# Patient Record
Sex: Female | Born: 1980 | Race: White | Hispanic: No | Marital: Single | Smoking: Former smoker
Health system: Southern US, Community
[De-identification: ages and names within clinical notes are randomized; demographics above are authoritative.]

## PROBLEM LIST (undated history)

## (undated) ENCOUNTER — Inpatient Hospital Stay (HOSPITAL_COMMUNITY): Payer: Self-pay

## (undated) DIAGNOSIS — R87629 Unspecified abnormal cytological findings in specimens from vagina: Secondary | ICD-10-CM

## (undated) DIAGNOSIS — R51 Headache: Secondary | ICD-10-CM

## (undated) DIAGNOSIS — F32A Depression, unspecified: Secondary | ICD-10-CM

## (undated) DIAGNOSIS — A749 Chlamydial infection, unspecified: Secondary | ICD-10-CM

## (undated) DIAGNOSIS — O98219 Gonorrhea complicating pregnancy, unspecified trimester: Secondary | ICD-10-CM

## (undated) DIAGNOSIS — IMO0002 Reserved for concepts with insufficient information to code with codable children: Secondary | ICD-10-CM

## (undated) DIAGNOSIS — F329 Major depressive disorder, single episode, unspecified: Secondary | ICD-10-CM

## (undated) DIAGNOSIS — N200 Calculus of kidney: Secondary | ICD-10-CM

## (undated) HISTORY — PX: COLPOSCOPY: SHX161

## (undated) HISTORY — DX: Unspecified abnormal cytological findings in specimens from vagina: R87.629

## (undated) HISTORY — DX: Major depressive disorder, single episode, unspecified: F32.9

## (undated) HISTORY — DX: Depression, unspecified: F32.A

## (undated) HISTORY — PX: LEEP: SHX91

---

## 2009-08-22 ENCOUNTER — Emergency Department (HOSPITAL_COMMUNITY): Admission: EM | Admit: 2009-08-22 | Discharge: 2009-08-22 | Payer: Self-pay | Admitting: Emergency Medicine

## 2010-09-05 ENCOUNTER — Inpatient Hospital Stay (HOSPITAL_COMMUNITY)
Admission: AD | Admit: 2010-09-05 | Discharge: 2010-09-06 | Payer: Self-pay | Source: Home / Self Care | Admitting: Obstetrics and Gynecology

## 2010-10-09 ENCOUNTER — Encounter (INDEPENDENT_AMBULATORY_CARE_PROVIDER_SITE_OTHER): Payer: Self-pay | Admitting: *Deleted

## 2010-10-09 ENCOUNTER — Ambulatory Visit
Admission: RE | Admit: 2010-10-09 | Discharge: 2010-10-09 | Payer: Self-pay | Source: Home / Self Care | Attending: Obstetrics and Gynecology | Admitting: Obstetrics and Gynecology

## 2010-10-09 LAB — CONVERTED CEMR LAB
Antibody Screen: NEGATIVE
Basophils Absolute: 0 10*3/uL (ref 0.0–0.1)
Basophils Relative: 0 % (ref 0–1)
Eosinophils Absolute: 0.2 10*3/uL (ref 0.0–0.7)
Eosinophils Relative: 2 % (ref 0–5)
HCT: 33.9 % — ABNORMAL LOW (ref 36.0–46.0)
HIV: NONREACTIVE
Hemoglobin: 11 g/dL — ABNORMAL LOW (ref 12.0–15.0)
Hepatitis B Surface Ag: NEGATIVE
Lymphocytes Relative: 21 % (ref 12–46)
Lymphs Abs: 2.5 10*3/uL (ref 0.7–4.0)
MCHC: 32.4 g/dL (ref 30.0–36.0)
MCV: 94.7 fL (ref 78.0–100.0)
Monocytes Absolute: 0.9 10*3/uL (ref 0.1–1.0)
Monocytes Relative: 7 % (ref 3–12)
Neutro Abs: 8.4 10*3/uL — ABNORMAL HIGH (ref 1.7–7.7)
Neutrophils Relative %: 70 % (ref 43–77)
Platelets: 218 10*3/uL (ref 150–400)
RBC: 3.58 M/uL — ABNORMAL LOW (ref 3.87–5.11)
RDW: 14 % (ref 11.5–15.5)
Rh Type: POSITIVE
Rubella: 59.8 intl units/mL — ABNORMAL HIGH
WBC: 12 10*3/uL — ABNORMAL HIGH (ref 4.0–10.5)

## 2010-10-10 ENCOUNTER — Encounter (INDEPENDENT_AMBULATORY_CARE_PROVIDER_SITE_OTHER): Payer: Self-pay | Admitting: *Deleted

## 2010-10-10 ENCOUNTER — Ambulatory Visit
Admission: RE | Admit: 2010-10-10 | Discharge: 2010-10-10 | Payer: Self-pay | Source: Home / Self Care | Attending: Obstetrics and Gynecology | Admitting: Obstetrics and Gynecology

## 2010-10-10 ENCOUNTER — Encounter: Payer: Self-pay | Admitting: Obstetrics and Gynecology

## 2010-10-10 LAB — POCT URINALYSIS DIPSTICK
Bilirubin Urine: NEGATIVE
Hemoglobin, Urine: NEGATIVE
Nitrite: NEGATIVE
Protein, ur: NEGATIVE mg/dL
Specific Gravity, Urine: >=1.03 (ref 1.005–1.030)
Urine Glucose, Fasting: NEGATIVE mg/dL
Urobilinogen, UA: 0.2 mg/dL (ref 0.0–1.0)
pH: 5.5 (ref 5.0–8.0)

## 2010-10-17 ENCOUNTER — Ambulatory Visit
Admission: RE | Admit: 2010-10-17 | Discharge: 2010-10-17 | Payer: Self-pay | Source: Home / Self Care | Attending: Obstetrics and Gynecology | Admitting: Obstetrics and Gynecology

## 2010-10-18 ENCOUNTER — Inpatient Hospital Stay (HOSPITAL_COMMUNITY)
Admission: AD | Admit: 2010-10-18 | Discharge: 2010-10-18 | Payer: Self-pay | Source: Home / Self Care | Attending: Obstetrics and Gynecology | Admitting: Obstetrics and Gynecology

## 2010-10-22 LAB — POCT URINALYSIS DIPSTICK
Bilirubin Urine: NEGATIVE
Hgb urine dipstick: NEGATIVE
Nitrite: NEGATIVE
Protein, ur: 30 mg/dL — AB
Specific Gravity, Urine: 1.025 (ref 1.005–1.030)
Urine Glucose, Fasting: NEGATIVE mg/dL
Urobilinogen, UA: 0.2 mg/dL (ref 0.0–1.0)
pH: 6 (ref 5.0–8.0)

## 2010-10-24 ENCOUNTER — Ambulatory Visit
Admission: RE | Admit: 2010-10-24 | Discharge: 2010-10-24 | Payer: Self-pay | Source: Home / Self Care | Attending: Obstetrics and Gynecology | Admitting: Obstetrics and Gynecology

## 2010-10-25 ENCOUNTER — Encounter: Payer: Self-pay | Admitting: Obstetrics and Gynecology

## 2010-10-29 LAB — POCT URINALYSIS DIPSTICK
Hgb urine dipstick: NEGATIVE
Ketones, ur: 15 mg/dL — AB
Nitrite: NEGATIVE
Protein, ur: 30 mg/dL — AB
Specific Gravity, Urine: >=1.03 (ref 1.005–1.030)
Urine Glucose, Fasting: NEGATIVE mg/dL
Urobilinogen, UA: 0.2 mg/dL (ref 0.0–1.0)
pH: 6 (ref 5.0–8.0)

## 2010-10-31 ENCOUNTER — Ambulatory Visit
Admission: RE | Admit: 2010-10-31 | Discharge: 2010-10-31 | Payer: Self-pay | Source: Home / Self Care | Attending: Obstetrics and Gynecology | Admitting: Obstetrics and Gynecology

## 2010-10-31 LAB — POCT URINALYSIS DIPSTICK
Bilirubin Urine: NEGATIVE
Hgb urine dipstick: NEGATIVE
Nitrite: NEGATIVE
Protein, ur: NEGATIVE mg/dL
Specific Gravity, Urine: 1.025 (ref 1.005–1.030)
Urine Glucose, Fasting: NEGATIVE mg/dL
Urobilinogen, UA: 0.2 mg/dL (ref 0.0–1.0)
pH: 6 (ref 5.0–8.0)

## 2010-11-03 ENCOUNTER — Inpatient Hospital Stay (HOSPITAL_COMMUNITY)
Admission: AD | Admit: 2010-11-03 | Discharge: 2010-11-03 | Payer: Self-pay | Source: Home / Self Care | Attending: Obstetrics & Gynecology | Admitting: Obstetrics & Gynecology

## 2010-11-03 LAB — URINALYSIS, ROUTINE W REFLEX MICROSCOPIC
Bilirubin Urine: NEGATIVE
Hgb urine dipstick: NEGATIVE
Ketones, ur: NEGATIVE mg/dL
Nitrite: NEGATIVE
Protein, ur: NEGATIVE mg/dL
Specific Gravity, Urine: 1.025 (ref 1.005–1.030)
Urine Glucose, Fasting: NEGATIVE mg/dL
Urobilinogen, UA: 0.2 mg/dL (ref 0.0–1.0)
pH: 6.5 (ref 5.0–8.0)

## 2010-11-03 LAB — URINE MICROSCOPIC-ADD ON

## 2010-11-05 ENCOUNTER — Inpatient Hospital Stay (HOSPITAL_COMMUNITY)
Admission: AD | Admit: 2010-11-05 | Discharge: 2010-11-05 | Payer: Self-pay | Source: Home / Self Care | Attending: Family Medicine | Admitting: Family Medicine

## 2010-11-07 ENCOUNTER — Other Ambulatory Visit: Payer: Self-pay

## 2010-11-07 DIAGNOSIS — O0930 Supervision of pregnancy with insufficient antenatal care, unspecified trimester: Secondary | ICD-10-CM

## 2010-11-08 LAB — POCT URINALYSIS DIPSTICK
Bilirubin Urine: NEGATIVE
Hgb urine dipstick: NEGATIVE
Ketones, ur: NEGATIVE mg/dL
Nitrite: NEGATIVE
Protein, ur: NEGATIVE mg/dL
Specific Gravity, Urine: 1.025 (ref 1.005–1.030)
Urine Glucose, Fasting: NEGATIVE mg/dL
Urobilinogen, UA: 0.2 mg/dL (ref 0.0–1.0)
pH: 6 (ref 5.0–8.0)

## 2010-11-14 ENCOUNTER — Other Ambulatory Visit: Payer: Self-pay

## 2010-11-14 DIAGNOSIS — O0930 Supervision of pregnancy with insufficient antenatal care, unspecified trimester: Secondary | ICD-10-CM

## 2010-11-14 LAB — POCT URINALYSIS DIPSTICK
Bilirubin Urine: NEGATIVE
Hgb urine dipstick: NEGATIVE
Nitrite: NEGATIVE
Protein, ur: NEGATIVE mg/dL
Specific Gravity, Urine: 1.025 (ref 1.005–1.030)
Urine Glucose, Fasting: NEGATIVE mg/dL
Urobilinogen, UA: 0.2 mg/dL (ref 0.0–1.0)
pH: 6.5 (ref 5.0–8.0)

## 2010-11-16 ENCOUNTER — Inpatient Hospital Stay (HOSPITAL_COMMUNITY)
Admission: AD | Admit: 2010-11-16 | Discharge: 2010-11-18 | DRG: 775 | Disposition: A | Discharge status: Home / Self Care | Payer: Medicaid Other | Source: Ambulatory Visit | Attending: Obstetrics & Gynecology | Admitting: Obstetrics & Gynecology

## 2010-11-16 DIAGNOSIS — Z22330 Carrier of Group B streptococcus: Secondary | ICD-10-CM

## 2010-11-16 DIAGNOSIS — O9989 Other specified diseases and conditions complicating pregnancy, childbirth and the puerperium: Secondary | ICD-10-CM

## 2010-11-16 DIAGNOSIS — O99892 Other specified diseases and conditions complicating childbirth: Principal | ICD-10-CM | POA: Diagnosis present

## 2010-11-16 LAB — CBC
HCT: 36.3 % (ref 36.0–46.0)
Hemoglobin: 12 g/dL (ref 12.0–15.0)
MCH: 30.4 pg (ref 26.0–34.0)
MCHC: 33.1 g/dL (ref 30.0–36.0)
MCV: 91.9 fL (ref 78.0–100.0)
Platelets: 242 10*3/uL (ref 150–400)
RBC: 3.95 MIL/uL (ref 3.87–5.11)
RDW: 13.6 % (ref 11.5–15.5)
WBC: 11.3 10*3/uL — ABNORMAL HIGH (ref 4.0–10.5)

## 2010-11-16 LAB — RPR: RPR Ser Ql: NONREACTIVE

## 2010-11-19 ENCOUNTER — Other Ambulatory Visit: Payer: Self-pay

## 2010-11-21 ENCOUNTER — Other Ambulatory Visit: Payer: Self-pay

## 2010-12-14 ENCOUNTER — Ambulatory Visit (INDEPENDENT_AMBULATORY_CARE_PROVIDER_SITE_OTHER): Payer: Medicaid Other | Admitting: Family Medicine

## 2010-12-19 LAB — URINALYSIS, ROUTINE W REFLEX MICROSCOPIC
Bilirubin Urine: NEGATIVE
Glucose, UA: NEGATIVE mg/dL
Hgb urine dipstick: NEGATIVE
Ketones, ur: NEGATIVE mg/dL
Nitrite: NEGATIVE
Protein, ur: NEGATIVE mg/dL
Specific Gravity, Urine: 1.02 (ref 1.005–1.030)
Urobilinogen, UA: 0.2 mg/dL (ref 0.0–1.0)
pH: 6.5 (ref 5.0–8.0)

## 2010-12-19 LAB — GC/CHLAMYDIA PROBE AMP, GENITAL
Chlamydia, DNA Probe: POSITIVE — AB
GC Probe Amp, Genital: NEGATIVE

## 2010-12-19 LAB — WET PREP, GENITAL
Clue Cells Wet Prep HPF POC: NONE SEEN
Trich, Wet Prep: NONE SEEN
Yeast Wet Prep HPF POC: NONE SEEN

## 2010-12-19 LAB — POCT PREGNANCY, URINE: Preg Test, Ur: POSITIVE

## 2010-12-28 NOTE — Progress Notes (Signed)
[name] [name], [name] [name] NO.:  [id number]  MEDICAL RECORD NO.:  [id number]           PATIENT TYPE:  A  LOCATION:  [facility]                   FACILITY:  [facility]  PHYSICIAN:  Tinnie Gens, MD        DATE OF BIRTH:  [birth year]  DATE OF SERVICE:                                 CLINIC NOTE  CHIEF COMPLAINT:  Postpartum check.  HISTORY OF PRESENT ILLNESS:  The patient is a 30 year old gravida 6, para 3-2-1-5 who is 6 weeks postpartum from vaginal delivery of a female infant that was 7 pounds 0 ounces.  She gave the baby for adoption to a family friend and she is still seeing the baby once a week or so.  She breast fed the baby for several weeks, but now they are bottle feeding. She does not have the baby at home, so there are no issues with that. She reports her mood was somewhat sad, but is now improving.  Her Edinburgh Postnatal Depression Scale is 7.  She is interested in pill for birth control.  She has not resumed sexual intercourse as yet.  She was on oral contraceptives in the past without difficulty.  She is a smoker.  PHYSICAL EXAMINATION:  VITAL SIGNS:  Today, as noted in the chart. GENERAL:  She is a well developed, well nourished woman in no acute distress. ABDOMEN:  Soft, nontender, nondistended. GU:  Normal external female genitalia.  BUS normal.  Vagina is pink and rugated.  Cervix is parous without lesion.  Uterus is small, well involuted, and nontender.  IMPRESSION:  Postpartum check, doing well.  No sign or symptoms of depression.  Last Pap was October 10, 2010.  Desiring oral contraceptives for birth control.  PLAN: 1. Return in 10 months for a Pap and pelvic. 2. Ovcon 1/35 one p.o. daily is given for birth control, 1-year     prescription was given.          ______________________________ Tinnie Gens, MD    TP/MEDQ  D:  12/14/2010  T:  12/15/2010  Job:  [id number]

## 2011-01-07 ENCOUNTER — Emergency Department (HOSPITAL_COMMUNITY)
Admission: EM | Admit: 2011-01-07 | Discharge: 2011-01-07 | Disposition: A | Discharge status: Home / Self Care | Payer: Medicaid Other | Attending: Emergency Medicine | Admitting: Emergency Medicine

## 2011-01-07 ENCOUNTER — Emergency Department (HOSPITAL_COMMUNITY): Payer: Medicaid Other

## 2011-01-07 DIAGNOSIS — R1031 Right lower quadrant pain: Secondary | ICD-10-CM | POA: Insufficient documentation

## 2011-01-07 DIAGNOSIS — N201 Calculus of ureter: Secondary | ICD-10-CM | POA: Insufficient documentation

## 2011-01-07 LAB — COMPREHENSIVE METABOLIC PANEL
ALT: 13 U/L (ref 0–35)
AST: 13 U/L (ref 0–37)
Albumin: 3.3 g/dL — ABNORMAL LOW (ref 3.5–5.2)
Alkaline Phosphatase: 66 U/L (ref 39–117)
BUN: 6 mg/dL (ref 6–23)
CO2: 23 mEq/L (ref 19–32)
Calcium: 9 mg/dL (ref 8.4–10.5)
Chloride: 109 mEq/L (ref 96–112)
Creatinine, Ser: 1.07 mg/dL (ref 0.4–1.2)
GFR calc Af Amer: >60 mL/min (ref >60)
GFR calc non Af Amer: >60 mL/min (ref >60)
Glucose, Bld: 100 mg/dL — ABNORMAL HIGH (ref 70–99)
Potassium: 3.7 mEq/L (ref 3.5–5.1)
Sodium: 139 mEq/L (ref 135–145)
Total Bilirubin: 0.7 mg/dL (ref 0.3–1.2)
Total Protein: 6.4 g/dL (ref 6.0–8.3)

## 2011-01-07 LAB — URINALYSIS, ROUTINE W REFLEX MICROSCOPIC
Glucose, UA: NEGATIVE mg/dL
pH: 6 (ref 5.0–8.0)

## 2011-01-07 LAB — URINE MICROSCOPIC-ADD ON

## 2011-01-07 LAB — DIFFERENTIAL
Basophils Absolute: 0 10*3/uL (ref 0.0–0.1)
Basophils Relative: 0 % (ref 0–1)
Eosinophils Absolute: 0.1 10*3/uL (ref 0.0–0.7)
Eosinophils Relative: 1 % (ref 0–5)
Lymphocytes Relative: 13 % (ref 12–46)
Lymphs Abs: 1.4 10*3/uL (ref 0.7–4.0)
Monocytes Absolute: 0.7 10*3/uL (ref 0.1–1.0)
Monocytes Relative: 7 % (ref 3–12)
Neutro Abs: 8.4 10*3/uL — ABNORMAL HIGH (ref 1.7–7.7)
Neutrophils Relative %: 79 % — ABNORMAL HIGH (ref 43–77)

## 2011-01-07 LAB — CBC
HCT: 35.9 % — ABNORMAL LOW (ref 36.0–46.0)
Hemoglobin: 11.4 g/dL — ABNORMAL LOW (ref 12.0–15.0)
MCH: 29.1 pg (ref 26.0–34.0)
MCHC: 31.8 g/dL (ref 30.0–36.0)
MCV: 91.6 fL (ref 78.0–100.0)
Platelets: 207 10*3/uL (ref 150–400)
RBC: 3.92 MIL/uL (ref 3.87–5.11)
RDW: 12.8 % (ref 11.5–15.5)
WBC: 10.6 10*3/uL — ABNORMAL HIGH (ref 4.0–10.5)

## 2011-01-07 LAB — POCT PREGNANCY, URINE: Preg Test, Ur: NEGATIVE

## 2011-01-07 LAB — WET PREP, GENITAL
Trich, Wet Prep: NONE SEEN
Yeast Wet Prep HPF POC: NONE SEEN

## 2011-01-07 LAB — LIPASE, BLOOD: Lipase: 19 U/L (ref 11–59)

## 2011-01-09 LAB — DIFFERENTIAL
Basophils Absolute: 0 10*3/uL (ref 0.0–0.1)
Eosinophils Absolute: 0.1 10*3/uL (ref 0.0–0.7)
Eosinophils Relative: 1 % (ref 0–5)
Lymphs Abs: 2.2 10*3/uL (ref 0.7–4.0)

## 2011-01-09 LAB — CBC
HCT: 37.3 % (ref 36.0–46.0)
MCHC: 34.6 g/dL (ref 30.0–36.0)
MCV: 90.7 fL (ref 78.0–100.0)
Platelets: 204 10*3/uL (ref 150–400)
RDW: 13.6 % (ref 11.5–15.5)

## 2011-01-09 LAB — POCT I-STAT, CHEM 8
BUN: 9 mg/dL (ref 6–23)
Creatinine, Ser: 0.7 mg/dL (ref 0.4–1.2)
Sodium: 141 mEq/L (ref 135–145)
TCO2: 26 mmol/L (ref 0–100)

## 2011-01-09 LAB — RPR: RPR Ser Ql: NONREACTIVE

## 2011-01-09 LAB — PROTIME-INR: Prothrombin Time: 13.6 seconds (ref 11.6–15.2)

## 2011-05-07 ENCOUNTER — Inpatient Hospital Stay (HOSPITAL_COMMUNITY): Payer: Self-pay | Admitting: Anesthesiology

## 2011-05-07 ENCOUNTER — Encounter (HOSPITAL_COMMUNITY)
Admission: AD | Disposition: A | Discharge status: Home / Self Care | Payer: Self-pay | Source: Ambulatory Visit | Attending: Obstetrics & Gynecology

## 2011-05-07 ENCOUNTER — Ambulatory Visit: Admit: 2011-05-07 | Payer: Self-pay | Admitting: Obstetrics & Gynecology

## 2011-05-07 ENCOUNTER — Other Ambulatory Visit: Payer: Self-pay | Admitting: Obstetrics & Gynecology

## 2011-05-07 ENCOUNTER — Emergency Department (HOSPITAL_COMMUNITY): Payer: Self-pay

## 2011-05-07 ENCOUNTER — Encounter (HOSPITAL_COMMUNITY): Payer: Self-pay | Admitting: Anesthesiology

## 2011-05-07 ENCOUNTER — Emergency Department (HOSPITAL_COMMUNITY)
Admission: EM | Admit: 2011-05-07 | Discharge: 2011-05-07 | Disposition: A | Discharge status: Skilled Nursing Facility | Payer: Self-pay | Source: Home / Self Care | Attending: Emergency Medicine | Admitting: Emergency Medicine

## 2011-05-07 ENCOUNTER — Ambulatory Visit (HOSPITAL_COMMUNITY)
Admission: AD | Admit: 2011-05-07 | Discharge: 2011-05-07 | Disposition: A | Discharge status: Home / Self Care | Payer: Self-pay | Source: Ambulatory Visit | Attending: Obstetrics & Gynecology | Admitting: Obstetrics & Gynecology

## 2011-05-07 ENCOUNTER — Encounter (HOSPITAL_COMMUNITY): Payer: Self-pay

## 2011-05-07 DIAGNOSIS — O034 Incomplete spontaneous abortion without complication: Secondary | ICD-10-CM | POA: Insufficient documentation

## 2011-05-07 HISTORY — PX: DILATION AND EVACUATION: SHX1459

## 2011-05-07 HISTORY — DX: Reserved for concepts with insufficient information to code with codable children: IMO0002

## 2011-05-07 HISTORY — DX: Calculus of kidney: N20.0

## 2011-05-07 HISTORY — DX: Chlamydial infection, unspecified: A74.9

## 2011-05-07 LAB — SAMPLE TO BLOOD BANK

## 2011-05-07 LAB — CBC
MCH: 29.4 pg (ref 26.0–34.0)
MCHC: 33.7 g/dL (ref 30.0–36.0)
Platelets: 201 10*3/uL (ref 150–400)
RBC: 3.91 MIL/uL (ref 3.87–5.11)
RDW: 15.3 % (ref 11.5–15.5)

## 2011-05-07 LAB — BASIC METABOLIC PANEL
BUN: 9 mg/dL (ref 6–23)
Calcium: 9.1 mg/dL (ref 8.4–10.5)
Creatinine, Ser: 0.56 mg/dL (ref 0.50–1.10)
GFR calc Af Amer: >60 mL/min (ref >60)
GFR calc non Af Amer: >60 mL/min (ref >60)

## 2011-05-07 LAB — PROTIME-INR: INR: 1.03 (ref 0.00–1.49)

## 2011-05-07 LAB — WET PREP, GENITAL

## 2011-05-07 SURGERY — DILATION AND EVACUATION, UTERUS
Anesthesia: General

## 2011-05-07 MED ORDER — FENTANYL CITRATE 0.05 MG/ML IJ SOLN
INTRAMUSCULAR | Status: DC | PRN
  Administered 2011-05-07: 100 ug via INTRAVENOUS

## 2011-05-07 MED ORDER — ACETAMINOPHEN 10 MG/ML IV SOLN
1000.0000 mg | Freq: Once | INTRAVENOUS | Status: DC | PRN
  Filled 2011-05-07: qty 100

## 2011-05-07 MED ORDER — PROMETHAZINE HCL 25 MG/ML IJ SOLN: 6.2500 mg | INTRAMUSCULAR | Status: DC | PRN

## 2011-05-07 MED ORDER — HYDROCODONE-ACETAMINOPHEN 5-325 MG PO TABS: 1.0000 | ORAL_TABLET | Freq: Four times a day (QID) | ORAL | Status: DC | PRN

## 2011-05-07 MED ORDER — HYDROMORPHONE HCL 1 MG/ML IJ SOLN: 0.2500 mg | INTRAMUSCULAR | Status: DC | PRN

## 2011-05-07 MED ORDER — PROPOFOL 10 MG/ML IV EMUL
INTRAVENOUS | Status: DC | PRN
  Administered 2011-05-07: 40 mg via INTRAVENOUS
  Administered 2011-05-07: 50 mg via INTRAVENOUS

## 2011-05-07 MED ORDER — MEPERIDINE HCL 25 MG/ML IJ SOLN: 6.2500 mg | INTRAMUSCULAR | Status: DC | PRN

## 2011-05-07 MED ORDER — EPHEDRINE SULFATE 50 MG/ML IJ SOLN
INTRAMUSCULAR | Status: DC | PRN
  Administered 2011-05-07 (×2): 10 mg via INTRAVENOUS

## 2011-05-07 MED ORDER — BUPIVACAINE-EPINEPHRINE 0.5% -1:200000 IJ SOLN
INTRAMUSCULAR | Status: DC | PRN
  Administered 2011-05-07: 10 mL

## 2011-05-07 MED ORDER — DOXYCYCLINE HYCLATE 100 MG PO TABS: 100.0000 mg | ORAL_TABLET | Freq: Two times a day (BID) | ORAL | Status: AC

## 2011-05-07 MED ORDER — HYDROCODONE-ACETAMINOPHEN 5-500 MG PO TABS: 1.0000 | ORAL_TABLET | ORAL | Status: AC | PRN

## 2011-05-07 MED ORDER — HYDROCODONE-ACETAMINOPHEN 5-325 MG PO TABS
ORAL_TABLET | ORAL | Status: AC
  Filled 2011-05-07: qty 1

## 2011-05-07 MED ORDER — KETOROLAC TROMETHAMINE 30 MG/ML IJ SOLN
30.0000 mg | Freq: Once | INTRAMUSCULAR | Status: AC
  Administered 2011-05-07: 30 mg via INTRAVENOUS
  Filled 2011-05-07: qty 1

## 2011-05-07 MED ORDER — LACTATED RINGERS IV SOLN
INTRAVENOUS | Status: DC
  Administered 2011-05-07: 13:00:00 via INTRAVENOUS

## 2011-05-07 MED ORDER — LIDOCAINE HCL (CARDIAC) 20 MG/ML IV SOLN
INTRAVENOUS | Status: DC | PRN
  Administered 2011-05-07: 60 mg via INTRAVENOUS

## 2011-05-07 MED ORDER — ACETAMINOPHEN 325 MG PO TABS: 325.0000 mg | ORAL_TABLET | ORAL | Status: DC | PRN

## 2011-05-07 MED ORDER — MIDAZOLAM HCL 5 MG/5ML IJ SOLN
INTRAMUSCULAR | Status: DC | PRN
  Administered 2011-05-07: 2 mg via INTRAVENOUS

## 2011-05-07 MED ORDER — ONDANSETRON HCL 4 MG/2ML IJ SOLN
INTRAMUSCULAR | Status: DC | PRN
  Administered 2011-05-07: 4 mg via INTRAVENOUS

## 2011-05-07 SURGICAL SUPPLY — 20 items
CATH ROBINSON RED A/P 16FR (CATHETERS) ×2 IMPLANT
CLOTH BEACON ORANGE TIMEOUT ST (SAFETY) ×2 IMPLANT
DECANTER SPIKE VIAL GLASS SM (MISCELLANEOUS) IMPLANT
DRAPE UTILITY XL STRL (DRAPES) ×2 IMPLANT
GLOVE BIO SURGEON STRL SZ 6.5 (GLOVE) ×2 IMPLANT
GLOVE BIOGEL PI IND STRL 7.0 (GLOVE) ×2 IMPLANT
GLOVE BIOGEL PI INDICATOR 7.0 (GLOVE) ×2
GOWN PREVENTION PLUS LG XLONG (DISPOSABLE) ×4 IMPLANT
KIT BERKELEY 1ST TRIMESTER 3/8 (MISCELLANEOUS) IMPLANT
NEEDLE SPNL 22GX3.5 QUINCKE BK (NEEDLE) IMPLANT
PACK VAGINAL MINOR WOMEN LF (CUSTOM PROCEDURE TRAY) ×2 IMPLANT
PAD PREP 24X48 CUFFED NSTRL (MISCELLANEOUS) ×2 IMPLANT
SET BERKELEY SUCTION TUBING (SUCTIONS) IMPLANT
SYR CONTROL 10ML LL (SYRINGE) IMPLANT
TOWEL OR 17X24 6PK STRL BLUE (TOWEL DISPOSABLE) ×4 IMPLANT
VACURETTE 10 RIGID CVD (CANNULA) ×2 IMPLANT
VACURETTE 7MM CVD STRL WRAP (CANNULA) IMPLANT
VACURETTE 8 RIGID CVD (CANNULA) IMPLANT
VACURETTE 9 RIGID CVD (CANNULA) IMPLANT
WATER STERILE IRR 1000ML POUR (IV SOLUTION) ×2 IMPLANT

## 2011-05-07 NOTE — Progress Notes (Signed)
Patient is brought in from [facility] by [facility]. She states that she had a vaginal delivery in February 2012 and has not started her period. She states that she started taking hcg activator ist week of march 2012 to lose weight. She states that she started bleeding heavily, so she stopped the pills. She states that she started having heavy gush of blood this 6am. She states that she bleeding out, tampons and pads were saturating almost immediately. She is also c/o lower abdominal contractions pain. She is currently on pitocin 20units in a liter bag of NS infusing at 222ml/hr via piv at her left ac (20g). She also had piv right ac. She has moderate of blood in her pad. New pad given.

## 2011-05-07 NOTE — Op Note (Signed)
Procedure(s): DILATATION AND EVACUATION (D&E) Procedure Note  Idamae Coccia female 30 y.o. 05/07/2011  Procedure(s) and Anesthesia Type:    * DILATATION AND EVACUATION (D&E) - General  Surgeon(s) and Role:    * Scheryl Darter, MD - Primary      Maryelizabeth Kaufmann, MD-Fellow  Indications: This is a 30 yo Z6X0960 presented to outside hospital complaining of vaginal bleeding.  Pt had a beta HCG of 1883 and was subsequently transfused 2U pRBCs and on examination had clots in the vagina.  Due to the patient's profuse bleeding, pt underwent suction D&E for incomplete miscarriage.  Risks/benefits/alternatives discussed with the patient including but not limited to risk of surgery, bleeding, uterine rupture, injury to internal organs.  She was verbally and written consented.          Surgeon: Gavrielle Streck   Anesthesia: Local anesthesia 0.5% bupivacaine and MAC  Procedure Detail  DILATATION AND EVACUATION (D&E) The patient was taken to the OR suite where anesthesia was found to be adequate.  Pt was prepped and draped in normal sterile fashion.  A exam under anesthesia revealed a dilated cervix of approximately 4-5 cm with products of conception at the cervix.  A sterile speculum was then inserted into the vagina and products of conception were visualized and removed with ring forceps.  After visualization of the cervix a paracervical block was then performed with 0.5% local.  The anterior lip of the cervix was grasped with a single tooth tenaculum.  An 8mm suction curette was then advanced in the uterus to the fundus.  The suction device was then activated, gently rotated until the gritty texture was noted.  Moderate products of conception were removed.  The suction curette was then reintroduced until all products of conception were adequately removed.  Bleeding markedly improved.  Tenaculum removed, and area found to be hemostatic.  The patient tolerated the procedure well and was taken to the  recovery area in stable condition.    Findings: Dilated cervix to 4-5 centimeters with products of conception in the vagina and cervical os., moderate amounts of products of conception  Estimated Blood Loss:  50 ml         Drains: none         Total IV Fluids: 700 ml  Blood Given: 2 Units CC PRBC          Specimens: products of conception-sent to pathology         Implants: none        Complications:  *none         Disposition: PACU - hemodynamically stable.         Condition: stable

## 2011-05-07 NOTE — Addendum Note (Signed)
Addendum  created 05/07/11 1654 by Velna Hatchet   Modules edited:Orders, PRL Based Order Sets

## 2011-05-07 NOTE — Anesthesia Preprocedure Evaluation (Signed)
Anesthesia Evaluation  Name, MR# and DOB Patient awake  General Assessment Comment  Reviewed: Allergy & Precautions, H&P , Patient's Chart, lab work & pertinent test results and reviewed documented beta blocker date and time   History of Anesthesia Complications Negative for: history of anesthetic complications  Airway Mallampati: II TM Distance: >3 FB Neck ROM: full    Dental No notable dental hx    Pulmonaryneg pulmonary ROS  asthma  clear to auscultation  pulmonary exam normal   Cardiovascular Exercise Tolerance: Good regular Normal   Neuro/PsychNegative Neurological ROS Negative Psych ROS  GI/Hepatic/Renal negative GI ROS, negative Liver ROS, and negative Renal ROS (+)       Endo/Other  Negative Endocrine ROS (+)   Abdominal   Musculoskeletal  Hematology negative hematology ROS (+)   Peds  Reproductive/Obstetrics negative OB ROS   Anesthesia Other Findings             Anesthesia Physical Anesthesia Plan  ASA: II  Anesthesia Plan: MAC   Post-op Pain Management:    Induction:   Airway Management Planned:   Additional Equipment:   Intra-op Plan:   Post-operative Plan:   Informed Consent: I have reviewed the patients History and Physical, chart, labs and discussed the procedure including the risks, benefits and alternatives for the proposed anesthesia with the patient or authorized representative who has indicated his/her understanding and acceptance.   Dental Advisory Given  Plan Discussed with: CRNA and Surgeon  Anesthesia Plan Comments:         Anesthesia Quick Evaluation

## 2011-05-07 NOTE — Transfer of Care (Signed)
Immediate Anesthesia Transfer of Care Note  Patient: [name] [name]  Procedure(s) Performed:  DILATATION AND EVACUATION (D&E)  Patient Location: PACU  Anesthesia Type: MAC  Level of Consciousness: awake, alert , oriented, patient cooperative and responds to stimulation  Airway & Oxygen Therapy: Patient Spontanous Breathing  Post-op Assessment: Report given to PACU RN, Post -op Vital signs reviewed and stable and Patient moving all extremities  Post vital signs: Reviewed and stable  Complications: No apparent anesthesia complications

## 2011-05-07 NOTE — ED Provider Notes (Signed)
Patient sent from [facility] ED for heavy vaginal bleeding. Dr. Debroah Loop is the accepting physician. Her bleeding has slowed and she appears stable. Blood infusing and pitocin running. Patient having severe cramping from the pit. Will d/c pti. Dr. Debroah Loop notified of patient arrival and will see patient in MAU.   [place], [place] 05/07/11 1320 See H&P

## 2011-05-07 NOTE — Anesthesia Postprocedure Evaluation (Signed)
  Anesthesia Post-op Note  Patient: [name] [name]  Procedure(s) Performed:  DILATATION AND EVACUATION (D&E)  Patient's cardiopulmonary status is stable Patient's level of consciousness: sedate but responsive verbally Pain and nausea are all reasonably controlled No anesthetic complications apparent at this time No follow up care necessary at this time

## 2011-05-07 NOTE — ED Provider Notes (Signed)
History   Patient was seen at [facility] ED today with pain and bleeding, and pregnancy was diagnosed. Ultrasound was c/w ongoing miscarriage. Because of heavy bleeding she was transfused, now finishing second unit of PRBC. Blood type Rh pos. She had a vaginal delivery here in Feb without complication.  Chief Complaint  Patient presents with  . Vaginal Bleeding   Vaginal Bleeding The patient's primary symptoms include pelvic pain and vaginal bleeding. This is a new problem. The current episode started today. The problem occurs intermittently. The problem has been unchanged. The pain is moderate. The problem affects both sides. She is pregnant. Pertinent negatives include no fever. The vaginal bleeding is heavier than menses. She has been passing clots. She has not been passing tissue. The symptoms are aggravated by nothing. She has tried nothing for the symptoms. She is sexually active. Her menstrual history has been irregular.    Pertinent Gynecological History: Menses: none since delivery in Feb. Bleeding: normal, none sine delivery in Feb. Contraception: none DES exposure: unknown Blood transfusions: 2 units today Sexually transmitted diseases: no past history Previous GYN Procedures: none  Last mammogram: none Last pap: normal Date:   Past Medical History  Diagnosis Date  . Chlamydia   . Asthma   . Abnormal Pap smear   . Kidney stones     Past Surgical History  Procedure Date  . Colposcopy   . Leep     Family History  Problem Relation Age of Onset  . Cancer Maternal Grandmother   . Depression Maternal Grandmother   . Cancer Maternal Grandfather   . Depression Maternal Grandfather   . Cancer Paternal Grandmother   . Depression Paternal Grandmother   . Cancer Paternal Grandfather   . Depression Paternal Grandfather     History  Substance Use Topics  . Smoking status: Current Everyday Smoker -- 0.2 packs/day    Types: Cigarettes  . Smokeless tobacco: Not on file  .  Alcohol Use: No    Allergies: No Known Allergies  No prescriptions prior to admission    Review of Systems  Constitutional: Negative for fever.  Genitourinary: Positive for vaginal bleeding and pelvic pain.  Neurological: Positive for weakness.   Physical Exam   Blood pressure 112/68, pulse 91, temperature 98.4 F (36.9 C), temperature source Oral, resp. rate 18, height 5\' 5"  (1.651 m), weight 62.596 kg (138 lb), SpO2 99.00%.  Physical Exam  Constitutional: She appears well-developed and well-nourished. No distress.  Cardiovascular: Normal rate.   Respiratory: Breath sounds normal.  GI: Soft. She exhibits no mass. There is no tenderness.  Genitourinary:       Clots in vagina, cervix posterior, open, uterus 6-8 size    MAU Course  Procedures   Assessment and Plan  Incomplete miscarriage, first trimester, heavy bleeding currently. I offered dilation and curettage, and the procedure and risks were explained, including anesthesia, bleeding, infection, pelvic organ damage. Her questions were answered.  Tanai Bouler 05/07/2011, 1:45 PM

## 2011-05-08 ENCOUNTER — Emergency Department (HOSPITAL_COMMUNITY)
Admission: EM | Admit: 2011-05-08 | Discharge: 2011-05-09 | Disposition: A | Discharge status: Home / Self Care | Payer: Medicaid Other | Attending: Emergency Medicine | Admitting: Emergency Medicine

## 2011-05-08 ENCOUNTER — Inpatient Hospital Stay (HOSPITAL_COMMUNITY)
Admission: AD | Admit: 2011-05-08 | Discharge: 2011-05-08 | Disposition: A | Discharge status: Home / Self Care | Payer: Medicaid Other | Source: Ambulatory Visit | Attending: Obstetrics and Gynecology | Admitting: Obstetrics and Gynecology

## 2011-05-08 ENCOUNTER — Encounter (HOSPITAL_COMMUNITY): Payer: Self-pay | Admitting: Obstetrics & Gynecology

## 2011-05-08 ENCOUNTER — Telehealth: Payer: Self-pay | Admitting: *Deleted

## 2011-05-08 DIAGNOSIS — H53149 Visual discomfort, unspecified: Secondary | ICD-10-CM | POA: Insufficient documentation

## 2011-05-08 DIAGNOSIS — R112 Nausea with vomiting, unspecified: Secondary | ICD-10-CM | POA: Insufficient documentation

## 2011-05-08 DIAGNOSIS — G43909 Migraine, unspecified, not intractable, without status migrainosus: Secondary | ICD-10-CM | POA: Insufficient documentation

## 2011-05-08 LAB — CROSSMATCH: Unit division: 0

## 2011-05-08 NOTE — Progress Notes (Signed)
Pt had D & C and blood transfusion 05/07/2011 for failed pregnancy.   Migraine headache since this morning, taken hydrocodone which is not helping.

## 2011-05-08 NOTE — Telephone Encounter (Signed)
Did not mean to open encounter .. 

## 2011-05-09 LAB — POCT I-STAT, CHEM 8
Chloride: 104 mEq/L (ref 96–112)
Creatinine, Ser: 0.6 mg/dL (ref 0.50–1.10)
Glucose, Bld: 77 mg/dL (ref 70–99)
Potassium: 3.6 mEq/L (ref 3.5–5.1)

## 2011-06-12 ENCOUNTER — Ambulatory Visit: Payer: Medicaid Other | Admitting: Obstetrics and Gynecology

## 2011-07-03 ENCOUNTER — Encounter: Payer: Self-pay | Admitting: *Deleted

## 2011-07-08 ENCOUNTER — Ambulatory Visit: Payer: Medicaid Other | Admitting: Obstetrics and Gynecology

## 2012-06-04 ENCOUNTER — Encounter (HOSPITAL_COMMUNITY): Payer: Self-pay | Admitting: *Deleted

## 2012-06-04 ENCOUNTER — Inpatient Hospital Stay (HOSPITAL_COMMUNITY)
Admission: AD | Admit: 2012-06-04 | Discharge: 2012-06-04 | Disposition: A | Discharge status: Home / Self Care | Payer: Medicaid Other | Source: Ambulatory Visit | Attending: Obstetrics and Gynecology | Admitting: Obstetrics and Gynecology

## 2012-06-04 DIAGNOSIS — O99891 Other specified diseases and conditions complicating pregnancy: Secondary | ICD-10-CM | POA: Insufficient documentation

## 2012-06-04 DIAGNOSIS — G43009 Migraine without aura, not intractable, without status migrainosus: Secondary | ICD-10-CM | POA: Insufficient documentation

## 2012-06-04 MED ORDER — BUTALBITAL-APAP-CAFFEINE 50-325-40 MG PO TABS: 1.0000 | ORAL_TABLET | ORAL | Status: DC | PRN

## 2012-06-04 MED ORDER — BUTALBITAL-APAP-CAFFEINE 50-325-40 MG PO TABS
2.0000 | ORAL_TABLET | Freq: Once | ORAL | Status: AC
  Administered 2012-06-04: 2 via ORAL
  Filled 2012-06-04: qty 2

## 2012-06-04 NOTE — MAU Provider Note (Signed)
Chief Complaint: Headache   First Provider Initiated Contact with Patient 06/04/12 0415     SUBJECTIVE HPI: [name] [name] is a 31 y.o. Z6X0960 at [redacted]w[redacted]d by LMP who presents with migraine HA on and off x 1 week, worse tonight. Accompanied by nausea.  No relief w/ Tylenol. Constant, pulsating pain in bilat temples wrapping around to neck. Hx Migraines. Aslo reports constipation. Denies fever, chills, neck stiffness, vomiting, URI Sx, allergy Sx, VB abd pain.  Past Medical History  Diagnosis Date  . Chlamydia   . Asthma   . Abnormal Pap smear   . Kidney stones    OB History    Grav Para Term Preterm Abortions TAB SAB Ect Mult Living   9 5 3 2 3  3   5      # Outc Date GA Lbr Len/2nd Wgt Sex Del Anes PTL Lv   1 CUR            2 TRM            3 TRM            4 TRM            5 PRE            6 PRE            7 SAB            8 SAB            9 SAB            Comments: System Generated. Please review and update pregnancy details.     Past Surgical History  Procedure Date  . Colposcopy   . Leep   . Dilation and evacuation 05/07/2011    Procedure: DILATATION AND EVACUATION (D&E);  Surgeon: Scheryl Darter, MD;  Location: [facility];  Service: Gynecology;  Laterality: N/A;   History   Social History  . Marital Status: Single    Spouse Name: N/A    Number of Children: N/A  . Years of Education: N/A   Occupational History  . Not on file.   Social History Main Topics  . Smoking status: Current Everyday Smoker -- 0.2 packs/day    Types: Cigarettes  . Smokeless tobacco: Not on file  . Alcohol Use: No  . Drug Use: Yes    Special: Marijuana  . Sexually Active: Yes    Birth Control/ Protection: Condom   Other Topics Concern  . Not on file   Social History Narrative  . No narrative on file   No current facility-administered medications on file prior to encounter.   Current Outpatient Prescriptions on File Prior to Encounter  Medication Sig Dispense Refill  . acetaminophen  (TYLENOL) 500 MG tablet Take 500 mg by mouth every 6 (six) hours as needed. For headache.        No Known Allergies  ROS: Pertinent items in HPI  OBJECTIVE Blood pressure 114/68, pulse 96, temperature 97.5 F (36.4 C), temperature source Oral, resp. rate 18, height 5\' 3"  (1.6 m), weight 72.576 kg (160 lb), last menstrual period 03/15/2012, SpO2 100.00%. GENERAL: Well-developed, well-nourished female in mild distress.  HEENT: Normocephalic, PERRLA.  HEART: normal rate RESP: normal effort ABDOMEN: Soft, non-tender EXTREMITIES: Nontender, no edema NEURO: Alert and oriented SPECULUM EXAM: deferred FHR: 157  LAB RESULTS NA  IMAGING No results found.   ED COURSE HA resolved w/ Fioricet  ASSESSMENT 1. Migraine without aura and responsive to treatment  PLAN Discharge home Medication List  As of 06/04/2012  5:00 AM   TAKE these medications         acetaminophen 500 MG tablet   Commonly known as: TYLENOL   Take 500 mg by mouth every 6 (six) hours as needed. For headache.      butalbital-acetaminophen-caffeine 50-325-40 MG per tablet   Commonly known as: FIORICET, ESGIC   Take 1-2 tablets by mouth every 4 (four) hours as needed for headache.      cephALEXin 500 MG capsule   Commonly known as: KEFLEX   Take 500 mg by mouth 3 (three) times daily.      multivitamin-prenatal 27-0.8 MG Tabs   Take 1 tablet by mouth daily.      ondansetron 4 MG disintegrating tablet   Commonly known as: ZOFRAN-ODT   Take 4 mg by mouth every 8 (eight) hours as needed.           Follow-up Information    Please follow up. (Start prenatal care)          Dorathy Kinsman, CNM 06/04/2012  5:00 AM

## 2012-06-04 NOTE — MAU Note (Signed)
Pt reports waking up with a headache for the last week. Today states she has had a migraine all day, has taken tylenol with no relief. Pt reports she is [redacted] weeks pregnant. Nausea, no vomiting.

## 2012-06-25 NOTE — MAU Provider Note (Signed)
Attestation of Attending Supervision of Advanced Practitioner: Evaluation and management procedures were performed by the PA/NP/CNM/OB Fellow under my supervision/collaboration. Chart reviewed and agree with management and plan.  Tilda Burrow 06/25/2012 8:46 PM

## 2012-07-03 LAB — OB RESULTS CONSOLE ABO/RH: RH Type: POSITIVE

## 2012-07-03 LAB — OB RESULTS CONSOLE HEPATITIS B SURFACE ANTIGEN: Hepatitis B Surface Ag: NEGATIVE

## 2012-07-03 LAB — OB RESULTS CONSOLE GC/CHLAMYDIA: Chlamydia: NEGATIVE

## 2012-07-03 LAB — OB RESULTS CONSOLE ANTIBODY SCREEN: Antibody Screen: NEGATIVE

## 2012-07-03 LAB — OB RESULTS CONSOLE RUBELLA ANTIBODY, IGM: Rubella: IMMUNE

## 2012-08-21 IMAGING — US US OB COMP +14 WK
1 series · 12 of 28 positions shown · non-contrast
Comparison: none

[Series 1: us ob comp +14 wk · 0.24mm/px · 12 of 55 slices shown]
[im 3/55]
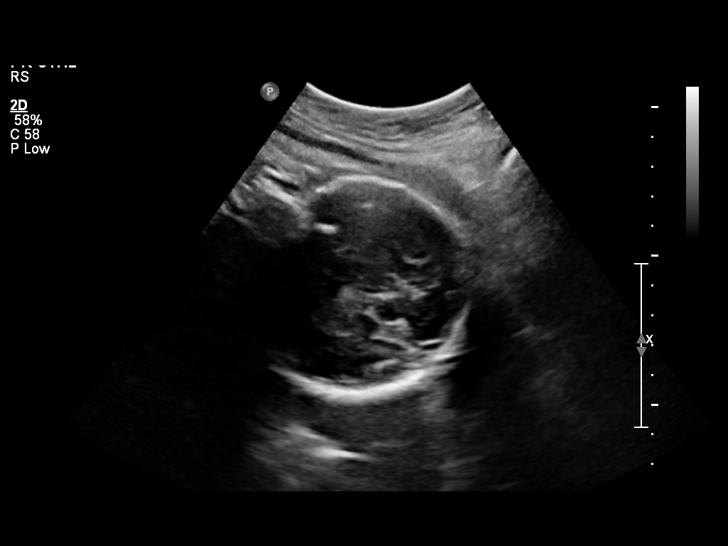
[im 7/55]
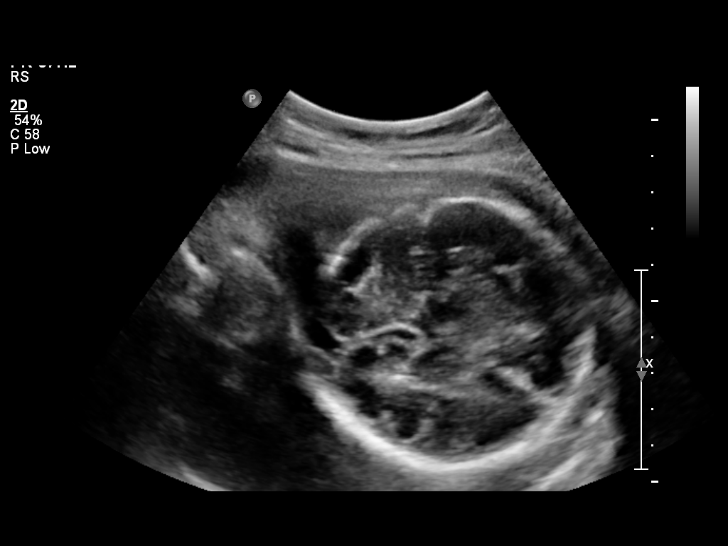
[im 11/55]
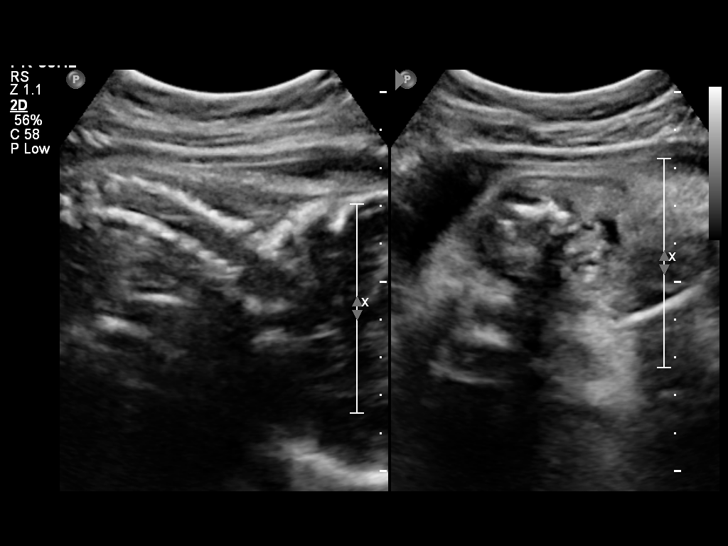
[im 17/55]
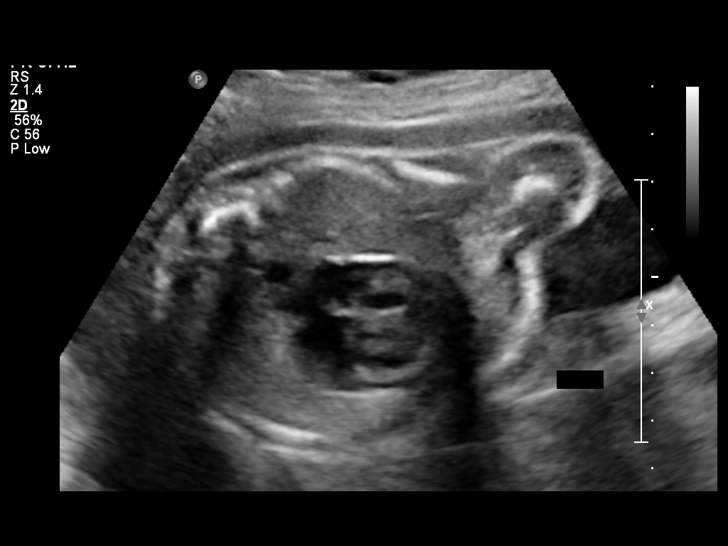
[im 21/55]
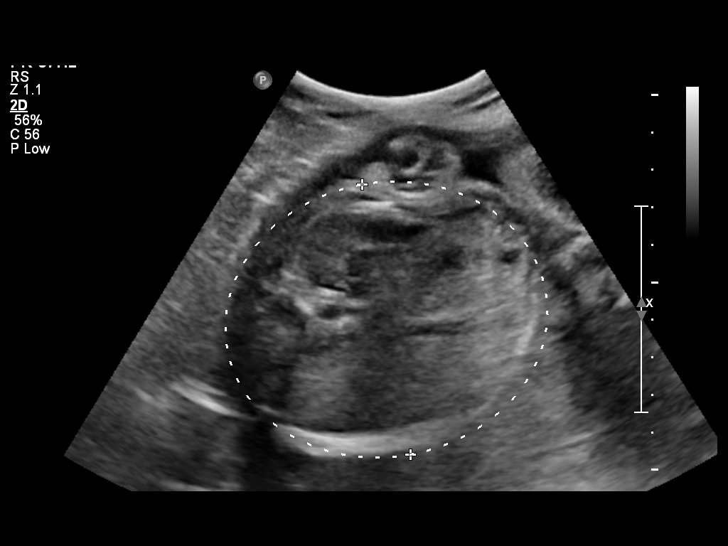
[im 25/55]
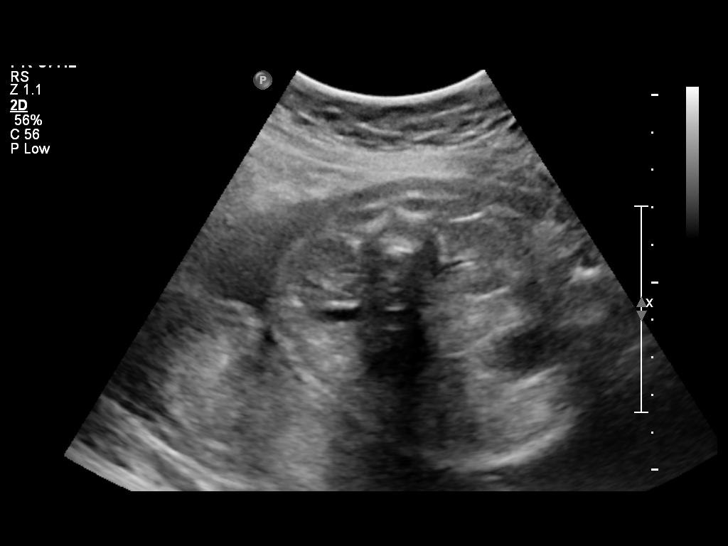
[im 31/55]
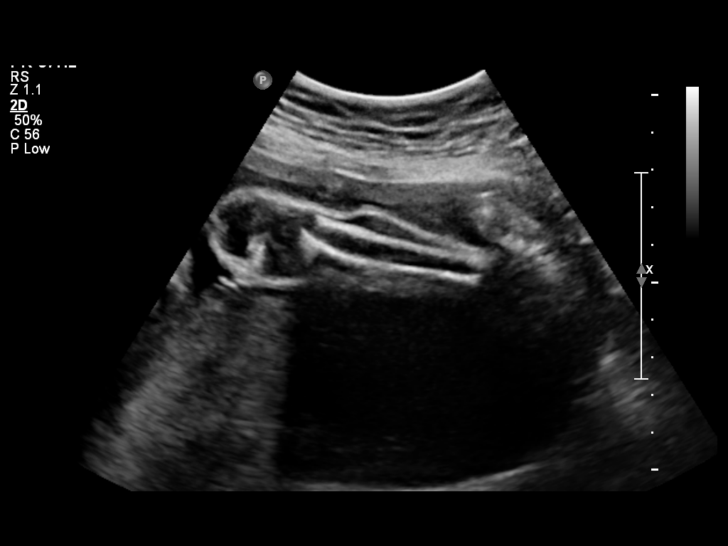
[im 35/55]
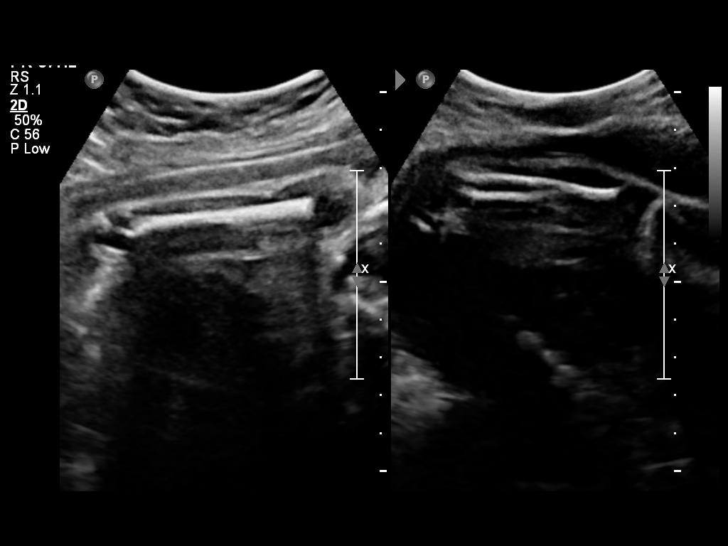
[im 39/55]
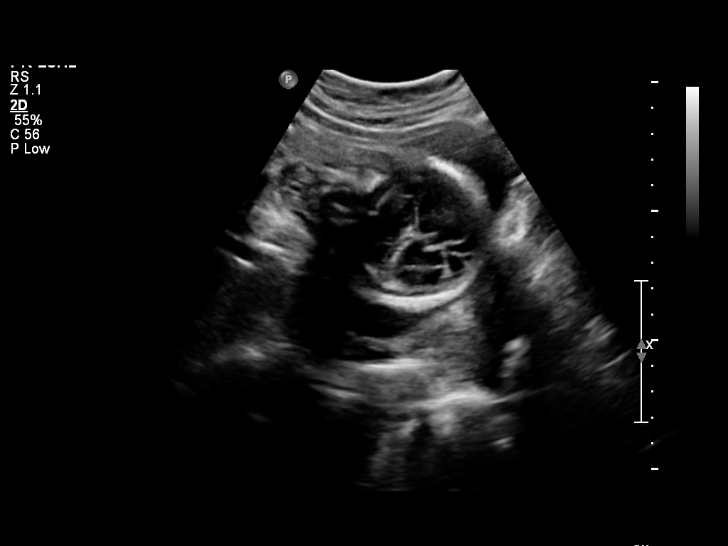
[im 45/55]
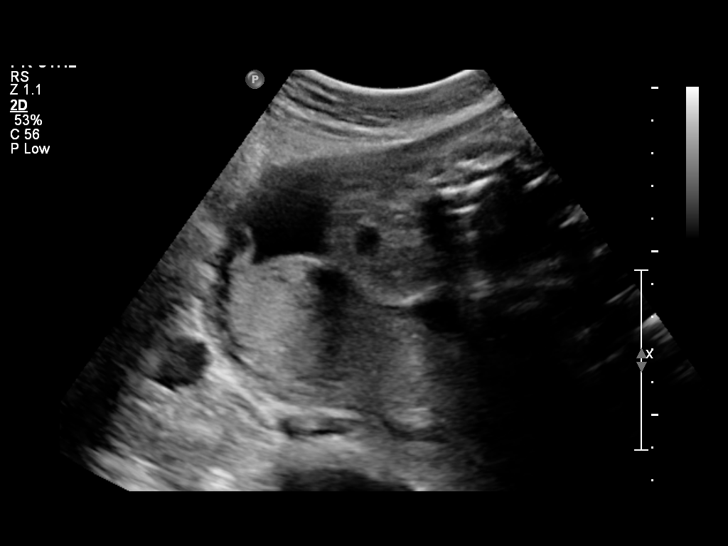
[im 49/55]
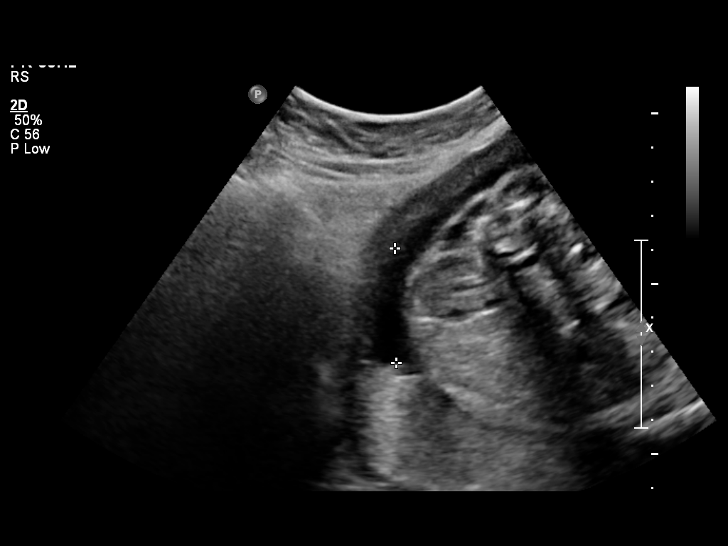
[im 53/55]
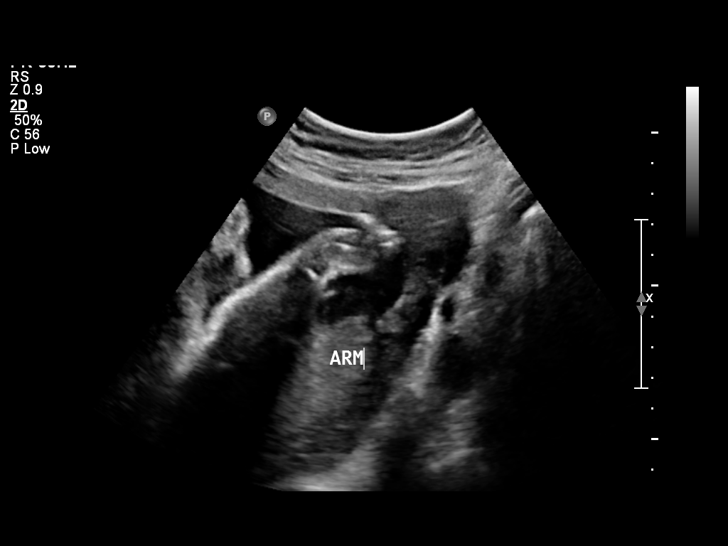

[12 of 28 positions shown; findings below may reference images not displayed]

OBSTETRICS REPORT
                      (Signed Final 09/06/2010 [DATE])

Procedures

 US OB COMP +14 WK                                     76805.1
Indications

 No or Little Prenatal Care
 Uncertain LMP;  Establish Gestational [AGE]
Fetal Evaluation

 Fetal Heart Rate:  126                          bpm
 Cardiac Activity:  Observed
 Presentation:      Cephalic
 Placenta:          Posterior, above cervical
                    os
 P. Cord            Not well visualized
 Insertion:

 Amniotic Fluid
 AFI FV:      Subjectively within normal limits
 AFI Sum:     16.95   cm       63  %Tile     Larg Pckt:    6.92  cm
 RUQ:   3.34    cm   RLQ:    3.26   cm    LUQ:   3.43    cm   LLQ:    6.92   cm
Biometry

 BPD:     73.9  mm     G. Age:  29w 5d                CI:         72.3   70 - 86
                                                      FL/HC:      20.0   19.2 -

 HC:     276.5  mm     G. Age:  30w 2d       35  %    HC/AC:      1.10   0.99 -

 AC:     250.6  mm     G. Age:  29w 2d       36  %    FL/BPD:     74.8   71 - 87
 FL:      55.3  mm     G. Age:  29w 1d       24  %    FL/AC:      22.1   20 - 24

 Est. FW:    2546  gm      3 lb 1 oz     48  %
Gestational Age

 LMP:           22w 5d        Date:  03/30/10                 EDD:   01/04/11
 U/S Today:     29w 4d                                        EDD:   11/17/10
 Best:          29w 4d     Det. By:  U/S (09/05/10)           EDD:   11/17/10
Anatomy
 Cranium:           Appears normal      Aortic Arch:       Basic anatomy
                                                           exam per order
 Fetal Cavum:       Appears normal      Ductal Arch:       Basic anatomy
                                                           exam per order
 Ventricles:        Appears normal      Diaphragm:         Basic anatomy
                                                           exam per order
 Choroid Plexus:    Not well            Stomach:           Appears
                    visualized                             normal, left
                                                           sided
 Cerebellum:        Not well            Abdomen:           Appears normal
                    visualized
 Posterior Fossa:   Not well            Abdominal Wall:    Not well
                    visualized                             visualized
 Nuchal Fold:       Not applicable      Cord Vessels:      Appears normal
                    (>20 wks GA)                           (3 vessel cord)
 Face:              Lips and orbits     Kidneys:           Appear normal
                    appear normal
 Heart:             Appears normal      Bladder:           Appears normal
                    (4 chamber &
                    axis)
 RVOT:              Basic anatomy       Spine:             Appears normal
                    exam per order
 LVOT:              Basic anatomy       Limbs:             Four extremities
                    exam per order                         visualized
                                                           (basic anatomy
                                                           exam)

 Other:     Technically difficult due to advanced GA and fetal
            position.
Cervix Uterus Adnexa

 Cervical Length:    3.4      cm

 Cervix:       Normal appearance by transabdominal scan.
 Uterus:       No abnormality visualized.
 Left Ovary:    Not visualized.
 Right Ovary:   Not visualized.

 Adnexa:     No abnormality visualized.
Impression

 Single live IUP in cephalic presentation.  Visualized
 structures normal and detailed above.  Amniotic fluid within
 normal limits, with AFI of 16.95 cm.

 questions or concerns.
                   Narine, Bogdan

## 2012-10-07 NOTE — L&D Delivery Note (Signed)
Delivery Note At 2:31 AM a viable female was delivered via Vaginal, Spontaneous Delivery (Presentation: LOA, compound presentation with posterior arm).   Placenta status: delivered intact with cord traction .  Loose nuchal cord x 1.  Anesthesia:  None   Episiotomy:  None  Lacerations: None Suture Repair: n/a Est. Blood Loss (mL): 100 ml  Mom to postpartum.  Baby to nursery-stable.  JACKSON-MOORE,Edana Aguado A 12/05/2012, 2:48 AM

## 2012-11-02 LAB — OB RESULTS CONSOLE GBS: GBS: NEGATIVE

## 2012-11-26 ENCOUNTER — Encounter (HOSPITAL_COMMUNITY): Payer: Self-pay | Admitting: *Deleted

## 2012-11-26 ENCOUNTER — Inpatient Hospital Stay (HOSPITAL_COMMUNITY)
Admission: AD | Admit: 2012-11-26 | Discharge: 2012-11-26 | Disposition: A | Discharge status: Home / Self Care | Payer: Medicaid Other | Source: Ambulatory Visit | Attending: Obstetrics | Admitting: Obstetrics

## 2012-11-26 DIAGNOSIS — M899 Disorder of bone, unspecified: Secondary | ICD-10-CM

## 2012-11-26 DIAGNOSIS — O99891 Other specified diseases and conditions complicating pregnancy: Secondary | ICD-10-CM | POA: Insufficient documentation

## 2012-11-26 DIAGNOSIS — N949 Unspecified condition associated with female genital organs and menstrual cycle: Secondary | ICD-10-CM | POA: Insufficient documentation

## 2012-11-26 HISTORY — DX: Headache: R51

## 2012-11-26 LAB — URINALYSIS, ROUTINE W REFLEX MICROSCOPIC
Bilirubin Urine: NEGATIVE
Glucose, UA: NEGATIVE mg/dL
Hgb urine dipstick: NEGATIVE
Ketones, ur: NEGATIVE mg/dL
Protein, ur: NEGATIVE mg/dL
pH: 6.5 (ref 5.0–8.0)

## 2012-11-26 LAB — URINE MICROSCOPIC-ADD ON

## 2012-11-26 MED ORDER — HYDROCODONE-ACETAMINOPHEN 5-325 MG PO TABS
2.0000 | ORAL_TABLET | Freq: Once | ORAL | Status: AC
  Administered 2012-11-26: 2 via ORAL
  Filled 2012-11-26: qty 2

## 2012-11-26 MED ORDER — HYDROCODONE-ACETAMINOPHEN 5-325 MG PO TABS: 1.0000 | ORAL_TABLET | Freq: Four times a day (QID) | ORAL | Status: DC | PRN

## 2012-11-26 NOTE — MAU Provider Note (Signed)
History     CSN: [id number]  Arrival date and time: 11/26/12 1108   First Provider Initiated Contact with Patient 11/26/12 1302      Chief Complaint  Patient presents with  . Pelvic Pain   HPI This is a 32 y.o. female at [redacted]w[redacted]d who presents with c/o pubic bone pain which has worsened recently making it difficult to walk or sleep. Not contracting today, was having painful ones last night. Denies leaking or bleeding.   RN Note: Pt reports having increased pelvic pressure making it difficult to walk . Had some contractions last night but not today. Reports normal vag discharge no bleeding and good fetal movment.      OB History   Grav Para Term Preterm Abortions TAB SAB Ect Mult Living   9 5 3 2 3  3   5       Past Medical History  Diagnosis Date  . Chlamydia   . Asthma   . Abnormal Pap smear   . Kidney stones   . Headache     migraines    Past Surgical History  Procedure Laterality Date  . Colposcopy    . Leep    . Dilation and evacuation  05/07/2011    Procedure: DILATATION AND EVACUATION (D&E);  Surgeon: Scheryl Darter, MD;  Location: [facility];  Service: Gynecology;  Laterality: N/A;    Family History  Problem Relation Age of Onset  . Cancer Maternal Grandmother   . Depression Maternal Grandmother   . Cancer Maternal Grandfather   . Depression Maternal Grandfather   . Cancer Paternal Grandmother   . Depression Paternal Grandmother   . Cancer Paternal Grandfather   . Depression Paternal Grandfather     History  Substance Use Topics  . Smoking status: Former Smoker -- 0.25 packs/day    Types: Cigarettes    Quit date: 03/26/2012  . Smokeless tobacco: Not on file  . Alcohol Use: No    Allergies: No Known Allergies  Prescriptions prior to admission  Medication Sig Dispense Refill  . acetaminophen (TYLENOL) 500 MG tablet Take 500 mg by mouth every 6 (six) hours as needed. For headache.       Marland Kitchen azithromycin (ZITHROMAX) 250 MG tablet Take 250 mg by mouth  daily. Pt took 2 tablets on 11/19/12, then one tablet every day after until  11/24/12.      . magnesium hydroxide (MILK OF MAGNESIA) 400 MG/5ML suspension Take 30 mLs by mouth daily as needed for constipation.      . Prenatal Vit-Fe Fumarate-FA (PRENATAL MULTIVITAMIN) TABS Take 1 tablet by mouth daily.        Review of Systems  Constitutional: Negative for fever and malaise/fatigue.  Gastrointestinal: Positive for abdominal pain.  Genitourinary: Negative for dysuria.  Musculoskeletal: Positive for joint pain (pubic bone).  Neurological: Negative for headaches.   Physical Exam   Height 5\' 4"  (1.626 m), weight 199 lb 2 oz (90.323 kg), last menstrual period 03/15/2012.  Physical Exam  Constitutional: She is oriented to person, place, and time. She appears well-developed and well-nourished. No distress.  HENT:  Head: Normocephalic.  Cardiovascular: Normal rate.   Respiratory: Effort normal.  GI: Soft. She exhibits no distension. There is no tenderness. There is no rebound and no guarding.  Genitourinary: Vagina normal and uterus normal. No vaginal discharge found.  Cervix 3/70/-1/vtx   Musculoskeletal: Normal range of motion. She exhibits tenderness (over pubic bone).  Neurological: She is alert and oriented to person, place, and time.  Skin: Skin is warm and dry.  Psychiatric: She has a normal mood and affect.    MAU Course  Procedures   Assessment and Plan  A:  SIUP at [redacted]w[redacted]d       Symphysis pubis dysfunction/pain  P:  Discharge      Rx Vicodin      Keep appt for Monday      Labor precautions.  Wynelle Bourgeois 11/26/2012, 1:12 PM

## 2012-11-26 NOTE — MAU Note (Signed)
Pt reports having increased pelvic pressure making it difficult to walk . Had some contractions last night but not today. Reports normal vag discharge no bleeding and good fetal movment.

## 2012-11-27 LAB — URINE CULTURE
Colony Count: NO GROWTH
Culture: NO GROWTH

## 2012-12-04 ENCOUNTER — Inpatient Hospital Stay (HOSPITAL_COMMUNITY)
Admission: AD | Admit: 2012-12-04 | Discharge: 2012-12-07 | DRG: 775 | Disposition: A | Discharge status: Home / Self Care | Payer: Medicaid Other | Source: Ambulatory Visit | Attending: Obstetrics | Admitting: Obstetrics

## 2012-12-04 ENCOUNTER — Encounter (HOSPITAL_COMMUNITY): Payer: Self-pay

## 2012-12-04 DIAGNOSIS — IMO0001 Reserved for inherently not codable concepts without codable children: Secondary | ICD-10-CM

## 2012-12-04 DIAGNOSIS — O328XX0 Maternal care for other malpresentation of fetus, not applicable or unspecified: Principal | ICD-10-CM | POA: Diagnosis present

## 2012-12-04 HISTORY — DX: Gonorrhea complicating pregnancy, unspecified trimester: O98.219

## 2012-12-04 LAB — CBC
Hemoglobin: 10.5 g/dL — ABNORMAL LOW (ref 12.0–15.0)
MCH: 30 pg (ref 26.0–34.0)
MCHC: 32.5 g/dL (ref 30.0–36.0)
Platelets: 198 10*3/uL (ref 150–400)
RDW: 14.3 % (ref 11.5–15.5)

## 2012-12-04 MED ORDER — BUTORPHANOL TARTRATE 1 MG/ML IJ SOLN
2.0000 mg | INTRAMUSCULAR | Status: DC | PRN
  Administered 2012-12-05: 2 mg via INTRAVENOUS
  Filled 2012-12-04: qty 2

## 2012-12-04 MED ORDER — OXYTOCIN 40 UNITS IN LACTATED RINGERS INFUSION - SIMPLE MED
1.0000 m[IU]/min | INTRAVENOUS | Status: DC
  Administered 2012-12-04: 2 m[IU]/min via INTRAVENOUS
  Filled 2012-12-04: qty 1000

## 2012-12-04 MED ORDER — LACTATED RINGERS IV SOLN: 500.0000 mL | INTRAVENOUS | Status: DC | PRN

## 2012-12-04 MED ORDER — TERBUTALINE SULFATE 1 MG/ML IJ SOLN: 0.2500 mg | Freq: Once | INTRAMUSCULAR | Status: AC | PRN

## 2012-12-04 MED ORDER — ACETAMINOPHEN 325 MG PO TABS: 650.0000 mg | ORAL_TABLET | ORAL | Status: DC | PRN

## 2012-12-04 MED ORDER — OXYCODONE-ACETAMINOPHEN 5-325 MG PO TABS
1.0000 | ORAL_TABLET | ORAL | Status: DC | PRN
  Administered 2012-12-05: 1 via ORAL
  Filled 2012-12-04: qty 1

## 2012-12-04 MED ORDER — CITRIC ACID-SODIUM CITRATE 334-500 MG/5ML PO SOLN: 30.0000 mL | ORAL | Status: DC | PRN

## 2012-12-04 MED ORDER — OXYTOCIN 40 UNITS IN LACTATED RINGERS INFUSION - SIMPLE MED
62.5000 mL/h | INTRAVENOUS | Status: DC
  Administered 2012-12-05: 62.5 mL/h via INTRAVENOUS

## 2012-12-04 MED ORDER — OXYTOCIN BOLUS FROM INFUSION
500.0000 mL | INTRAVENOUS | Status: DC
  Administered 2012-12-05: 500 mL via INTRAVENOUS

## 2012-12-04 MED ORDER — LACTATED RINGERS IV SOLN
INTRAVENOUS | Status: DC
  Administered 2012-12-04: 22:00:00 via INTRAVENOUS

## 2012-12-04 MED ORDER — LIDOCAINE HCL (PF) 1 % IJ SOLN
30.0000 mL | INTRAMUSCULAR | Status: DC | PRN
  Filled 2012-12-04 (×2): qty 30

## 2012-12-04 MED ORDER — IBUPROFEN 600 MG PO TABS
600.0000 mg | ORAL_TABLET | Freq: Four times a day (QID) | ORAL | Status: DC | PRN
  Administered 2012-12-05: 600 mg via ORAL
  Filled 2012-12-04: qty 1

## 2012-12-04 MED ORDER — ONDANSETRON HCL 4 MG/2ML IJ SOLN: 4.0000 mg | Freq: Four times a day (QID) | INTRAMUSCULAR | Status: DC | PRN

## 2012-12-04 NOTE — Progress Notes (Signed)
Report called to [facility] in [facility]. Pt to 168 from Triage

## 2012-12-04 NOTE — MAU Note (Signed)
Leaking clear fld since 1830. Contractions started after leaking started

## 2012-12-05 ENCOUNTER — Encounter (HOSPITAL_COMMUNITY): Payer: Self-pay | Admitting: *Deleted

## 2012-12-05 DIAGNOSIS — IMO0001 Reserved for inherently not codable concepts without codable children: Secondary | ICD-10-CM

## 2012-12-05 LAB — TYPE AND SCREEN: ABO/RH(D): O POS

## 2012-12-05 LAB — RPR: RPR Ser Ql: NONREACTIVE

## 2012-12-05 MED ORDER — BENZOCAINE-MENTHOL 20-0.5 % EX AERO
1.0000 "application " | INHALATION_SPRAY | CUTANEOUS | Status: DC | PRN
  Administered 2012-12-05: 1 via TOPICAL
  Filled 2012-12-05: qty 56

## 2012-12-05 MED ORDER — ONDANSETRON HCL 4 MG/2ML IJ SOLN: 4.0000 mg | INTRAMUSCULAR | Status: DC | PRN

## 2012-12-05 MED ORDER — MAGNESIUM HYDROXIDE 400 MG/5ML PO SUSP: 30.0000 mL | ORAL | Status: DC | PRN

## 2012-12-05 MED ORDER — TETANUS-DIPHTH-ACELL PERTUSSIS 5-2.5-18.5 LF-MCG/0.5 IM SUSP: 0.5000 mL | Freq: Once | INTRAMUSCULAR | Status: DC

## 2012-12-05 MED ORDER — FERROUS SULFATE 325 (65 FE) MG PO TABS
325.0000 mg | ORAL_TABLET | Freq: Two times a day (BID) | ORAL | Status: DC
  Administered 2012-12-05 – 2012-12-07 (×5): 325 mg via ORAL
  Filled 2012-12-05 (×5): qty 1

## 2012-12-05 MED ORDER — PHENYLEPHRINE 40 MCG/ML (10ML) SYRINGE FOR IV PUSH (FOR BLOOD PRESSURE SUPPORT)
80.0000 ug | PREFILLED_SYRINGE | INTRAVENOUS | Status: DC | PRN
  Filled 2012-12-05: qty 2

## 2012-12-05 MED ORDER — EPHEDRINE 5 MG/ML INJ
10.0000 mg | INTRAVENOUS | Status: DC | PRN
  Filled 2012-12-05: qty 2

## 2012-12-05 MED ORDER — PRENATAL MULTIVITAMIN CH
1.0000 | ORAL_TABLET | Freq: Every day | ORAL | Status: DC
  Administered 2012-12-05 – 2012-12-07 (×3): 1 via ORAL
  Filled 2012-12-05 (×3): qty 1

## 2012-12-05 MED ORDER — PHENYLEPHRINE 40 MCG/ML (10ML) SYRINGE FOR IV PUSH (FOR BLOOD PRESSURE SUPPORT)
80.0000 ug | PREFILLED_SYRINGE | INTRAVENOUS | Status: DC | PRN
  Filled 2012-12-05: qty 5
  Filled 2012-12-05: qty 2

## 2012-12-05 MED ORDER — DIBUCAINE 1 % RE OINT: 1.0000 "application " | TOPICAL_OINTMENT | RECTAL | Status: DC | PRN

## 2012-12-05 MED ORDER — SENNOSIDES-DOCUSATE SODIUM 8.6-50 MG PO TABS
2.0000 | ORAL_TABLET | Freq: Every day | ORAL | Status: DC
  Administered 2012-12-05 – 2012-12-06 (×2): 2 via ORAL

## 2012-12-05 MED ORDER — LACTATED RINGERS IV SOLN: 500.0000 mL | Freq: Once | INTRAVENOUS | Status: DC

## 2012-12-05 MED ORDER — EPHEDRINE 5 MG/ML INJ
10.0000 mg | INTRAVENOUS | Status: DC | PRN
  Filled 2012-12-05: qty 4
  Filled 2012-12-05: qty 2

## 2012-12-05 MED ORDER — OXYCODONE-ACETAMINOPHEN 5-325 MG PO TABS
1.0000 | ORAL_TABLET | ORAL | Status: DC | PRN
  Administered 2012-12-05 – 2012-12-06 (×10): 1 via ORAL
  Administered 2012-12-07: 2 via ORAL
  Administered 2012-12-07 (×2): 1 via ORAL
  Filled 2012-12-05 (×12): qty 1
  Filled 2012-12-05: qty 2

## 2012-12-05 MED ORDER — DIPHENHYDRAMINE HCL 25 MG PO CAPS: 25.0000 mg | ORAL_CAPSULE | Freq: Four times a day (QID) | ORAL | Status: DC | PRN

## 2012-12-05 MED ORDER — WITCH HAZEL-GLYCERIN EX PADS: 1.0000 "application " | MEDICATED_PAD | CUTANEOUS | Status: DC | PRN

## 2012-12-05 MED ORDER — FENTANYL 2.5 MCG/ML BUPIVACAINE 1/10 % EPIDURAL INFUSION (WH - ANES)
14.0000 mL/h | INTRAMUSCULAR | Status: DC
Start: 2012-12-05 — End: 2012-12-05
  Filled 2012-12-05: qty 125

## 2012-12-05 MED ORDER — ZOLPIDEM TARTRATE 5 MG PO TABS: 5.0000 mg | ORAL_TABLET | Freq: Every evening | ORAL | Status: DC | PRN

## 2012-12-05 MED ORDER — DIPHENHYDRAMINE HCL 50 MG/ML IJ SOLN
12.5000 mg | INTRAMUSCULAR | Status: DC | PRN
Start: 2012-12-05 — End: 2012-12-05

## 2012-12-05 MED ORDER — IBUPROFEN 600 MG PO TABS
600.0000 mg | ORAL_TABLET | Freq: Four times a day (QID) | ORAL | Status: DC
  Administered 2012-12-05 – 2012-12-07 (×9): 600 mg via ORAL
  Filled 2012-12-05 (×8): qty 1

## 2012-12-05 MED ORDER — ONDANSETRON HCL 4 MG PO TABS: 4.0000 mg | ORAL_TABLET | ORAL | Status: DC | PRN

## 2012-12-05 MED ORDER — MEASLES, MUMPS & RUBELLA VAC ~~LOC~~ INJ
0.5000 mL | INJECTION | Freq: Once | SUBCUTANEOUS | Status: DC
  Filled 2012-12-05: qty 0.5

## 2012-12-05 MED ORDER — LANOLIN HYDROUS EX OINT: TOPICAL_OINTMENT | CUTANEOUS | Status: DC | PRN

## 2012-12-05 NOTE — Progress Notes (Signed)
Patient ID: [name] [name], female   DOB: 1981/04/28, 32 y.o.   MRN: [id number] Post Partum Day 0 S/P spontaneous vaginal RH status/Rubella reviewed.  Feeding: breast Subjective: No HA, SOB, CP, F/C, breast symptoms. Normal vaginal bleeding, no clots.     Objective: BP 106/62  Pulse 91  Temp(Src) 98.7 F (37.1 C) (Oral)  Resp 18  Ht 5\' 5"  (1.651 m)  Wt 90.719 kg (200 lb)  BMI 33.28 kg/m2  SpO2 100%  LMP 03/15/2012   Physical Exam:  General: alert Lochia: appropriate Uterine Fundus: firm DVT Evaluation: No evidence of DVT seen on physical exam. Ext: No c/c/e  Recent Labs  12/04/12 2232  HGB 10.5*  HCT 32.3*      Assessment/Plan: 32 y.o.  PPD #0 .  normal postpartum exam Continue current postpartum care  Ambulate   LOS: 1 day   [name] [name] 12/05/2012, 9:38 AM

## 2012-12-05 NOTE — H&P (Signed)
[name] [name] is a 32 y.o. female presenting for SROM. Maternal Medical History:  Reason for admission: Rupture of membranes.   Contractions: Frequency: irregular.    Fetal activity: Perceived fetal activity is normal.    Prenatal complications: Infection: GC/CT pos.     OB History   Grav Para Term Preterm Abortions TAB SAB Ect Mult Living   8 5 3 2 2  2   5      Past Medical History  Diagnosis Date  . Chlamydia   . Abnormal Pap smear   . Kidney stones   . Headache     migraines  . Gonorrhea in pregnancy    Past Surgical History  Procedure Laterality Date  . Colposcopy    . Leep    . Dilation and evacuation  05/07/2011    Procedure: DILATATION AND EVACUATION (D&E);  Surgeon: Scheryl Darter, MD;  Location: [facility];  Service: Gynecology;  Laterality: N/A;   Family History: family history includes Cancer in her maternal grandfather, maternal grandmother, paternal grandfather, and paternal grandmother and Depression in her maternal grandfather, maternal grandmother, paternal grandfather, and paternal grandmother. Social History:  reports that she quit smoking about 8 months ago. Her smoking use included Cigarettes. She smoked 0.25 packs per day. She does not have any smokeless tobacco history on file. She reports that she does not drink alcohol or use illicit drugs.     Review of Systems  Constitutional: Negative for fever.  Eyes: Negative for blurred vision.  Respiratory: Negative for shortness of breath.   Gastrointestinal: Negative for vomiting.  Skin: Negative for rash.  Neurological: Negative for headaches.    Dilation: 9 Effacement (%): 90 Station: 0 Exam by:: A.Davis,RN Blood pressure 129/84, pulse 89, temperature 98.7 F (37.1 C), temperature source Oral, resp. rate 18, height 5\' 5"  (1.651 m), weight 90.719 kg (200 lb), last menstrual period 03/15/2012, SpO2 100.00%. Maternal Exam:  Abdomen: not evaluated.  Introitus: not evaluated.   Cervix: Cervix evaluated by  digital exam.     Fetal Exam Fetal Monitor Review: Variability: moderate (6-25 bpm).   Pattern: no decelerations and accelerations present.    Fetal State Assessment: Category I - tracings are normal.     Physical Exam  Constitutional: She appears well-developed.  HENT:  Head: Normocephalic.  Neck: Neck supple. No thyromegaly present.  Cardiovascular: Normal rate and regular rhythm.   Respiratory: Breath sounds normal.  GI: Soft. Bowel sounds are normal.  Skin: No rash noted.    Prenatal labs: ABO, Rh: --/--/O POS (02/28 2230) Antibody: NEG (02/28 2230) Rubella: Immune (09/27 0000) RPR: Nonreactive (09/27 0000)  HBsAg: Negative (09/27 0000)  HIV: Non-reactive (09/27 0000)  GBS: Negative (01/27 0000)   Assessment/Plan: Multipara at term, active labor, Category 1 FHT Admit, anticipate an NSVD   JACKSON-MOORE,Leonna Schlee A 12/05/2012, 2:44 AM

## 2012-12-06 NOTE — Clinical Social Work Note (Signed)
Clinical Social Work Department PSYCHOSOCIAL ASSESSMENT - MATERNAL/CHILD 12/06/2012  Patient:  [name] [name],[name] [name]  Account Number:  [id number]  Admit Date:  12/04/2012  Childs Name:   Faith [name]    Clinical Social Worker:  RACHEL VAUGHN, LCSW   Date/Time:  12/06/2012 01:00 PM  Date Referred:  12/06/2012   Referral source  Physician  RN     Referred reason  LPNC  Substance Abuse   Other referral source:    I:  FAMILY / HOME ENVIRONMENT Child's legal guardian:  PARENT  Guardian - Name Guardian - Age Guardian - Address  [name] [name] 31 [address] [place], [address]  [name] [name]  [address] [place], [address]   Other household support members/support persons Name Relationship DOB  32 year old    32 year old    32 year old    32 year old     Other support:   MOB reports good family and friend support    II  PSYCHOSOCIAL DATA Information Source:  Patient Interview  Financial and Community Resources Employment:   Financial resources:  Medicaid If Medicaid - County:  [place] Other  Food Stamps  WIC   School / Grade:   Maternity Care Coordinator / Child Services Coordination / Early Interventions:  Cultural issues impacting care:    III  STRENGTHS Strengths  Adequate Resources  Home prepared for Child (including basic supplies)  Supportive family/friends   Strength comment:    IV  RISK FACTORS AND CURRENT PROBLEMS Current Problem:  None   Risk Factor & Current Problem Patient Issue Family Issue Risk Factor / Current Problem Comment   N N     V  SOCIAL WORK ASSESSMENT CSW spoke with MOB alone at bedside.  CSW explored with MOB any emotional concerns. MOB reports no emotional concerns. MOB reports some PPD 5 years ago, however none with her last pregnancy, and continues to know what symptoms to look out for.  CSW discussed supplies and family support.  MOB reports no concerns with supplies at this time.  MOB reports recently moving to [place] and asked several questions  about switching her medicaid.  CSW discussed process with MOB. MOB reports she also plans to look into WIC when she goes to [place] [facility] after discharge.  CSW hx of MJ use.  MOB reports this was several years ago and has no current concerns.  CSW discussed hospital policy to screen and negative UDS results.  CSW explained MEC screen and follow-up if results are positive. No barriers to discharge at this time.  Please reconsult CSW if further needs arise.      VI SOCIAL WORK PLAN  Type of pt/family education:   If child protective services report - county:   If child protective services report - date:   Information/referral to community resources comment:   Other social work plan:    

## 2012-12-06 NOTE — Progress Notes (Signed)
Patient ID: [name] [name], female   DOB: Jun 02, 1981, 32 y.o.   MRN: [id number] Post Partum Day 1 S/P spontaneous vaginal RH status/Rubella reviewed.  Feeding: breast Subjective: No HA, SOB, CP, F/C, breast symptoms. Normal vaginal bleeding, no clots.     Objective: BP 93/66  Pulse 75  Temp(Src) 97.9 F (36.6 C) (Oral)  Resp 18  Ht 5\' 5"  (1.651 m)  Wt 90.719 kg (200 lb)  BMI 33.28 kg/m2  SpO2 100%  LMP 03/15/2012   Physical Exam:  General: alert Lochia: appropriate Uterine Fundus: firm DVT Evaluation: No evidence of DVT seen on physical exam. Ext: No c/c/e   Assessment/Plan: 32 y.o.  PPD #1 .  normal postpartum exam Continue current postpartum care  Ambulate   LOS: 2 days   JACKSON-MOORE,LISA A 12/06/2012, 8:37 AM

## 2012-12-06 NOTE — Progress Notes (Signed)
Additional information left off of last note.  MOB reported to CSW [facility] at 3 months was due to she did not know that she was pregnant until that time.

## 2012-12-07 LAB — GC/CHLAMYDIA PROBE AMP
CT Probe RNA: NEGATIVE
GC Probe RNA: NEGATIVE

## 2012-12-07 MED ORDER — INFLUENZA VIRUS VACC SPLIT PF IM SUSP
0.5000 mL | Freq: Once | INTRAMUSCULAR | Status: AC
  Administered 2012-12-07: 0.5 mL via INTRAMUSCULAR

## 2012-12-07 MED ORDER — PNEUMOCOCCAL VAC POLYVALENT 25 MCG/0.5ML IJ INJ
0.5000 mL | INJECTION | INTRAMUSCULAR | Status: AC
  Administered 2012-12-07: 0.5 mL via INTRAMUSCULAR
  Filled 2012-12-07: qty 0.5

## 2012-12-07 NOTE — Progress Notes (Signed)
Pt wanting infant bottle fed.Cons to breastfeeding given to pt.  Pt.  Verbalized understanding but still wanted to bottle feed. Daron Offer given to infant only 3 cc's taken infant given to mother and infant starting to eat formula.

## 2012-12-07 NOTE — Discharge Summary (Signed)
Obstetric Discharge Summary Reason for Admission: onset of labor Prenatal Procedures: none Intrapartum Procedures: spontaneous vaginal delivery Postpartum Procedures: none Complications-Operative and Postpartum: none Hemoglobin  Date Value Range Status  12/04/2012 10.5* 12.0 - 15.0 g/dL Final     HCT  Date Value Range Status  12/04/2012 32.3* 36.0 - 46.0 % Final    Physical Exam:  General: alert Lochia: appropriate Uterine Fundus: firm Incision: healing well DVT Evaluation: No evidence of DVT seen on physical exam.  Discharge Diagnoses: Term Pregnancy-delivered  Discharge Information: Date: 12/07/2012 Activity: pelvic rest Diet: routine Medications: Percocet Condition: stable Instructions: refer to practice specific booklet Discharge to: home Follow-up Information   Follow up with MARSHALL,BERNARD A, MD. Call in 6 weeks.   Contact information:   [address] [place] [address] [phone]       Newborn Data: Live born female  Birth Weight: 7 lb 0.9 oz (3201 g) APGAR: 8, 9  Home with mother.  MARSHALL,BERNARD A 12/07/2012, 6:46 AM

## 2012-12-07 NOTE — Progress Notes (Signed)
UR chart review completed.  

## 2013-04-22 IMAGING — US US OB COMP LESS 14 WK
1 series · 14 of 28 positions shown · non-contrast
Comparison: None.

CLINICAL DATA: Vaginal bleeding.  Positive urine pregnancy test.
Rule out ectopic pregnancy.

OBSTETRIC <14 WK US AND TRANSVAGINAL OB US
TECHNIQUE: Both transabdominal and transvaginal ultrasound
examinations were performed for complete evaluation of the
gestation as well as the maternal uterus, adnexal regions, and
pelvic cul-de-sac.  Transvaginal technique was performed to assess
early pregnancy.

[Series 1: us ob comp less 14 wk · 0.30mm/px · 14 of 63 slices shown]
[im 3/63]
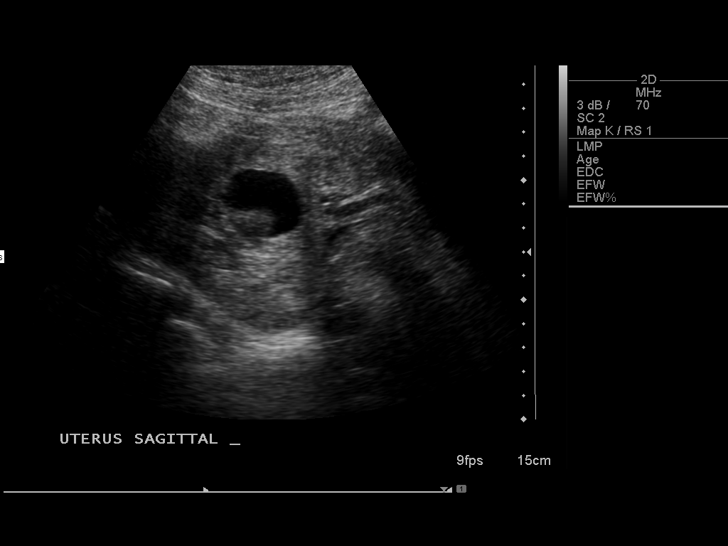
[im 7/63]
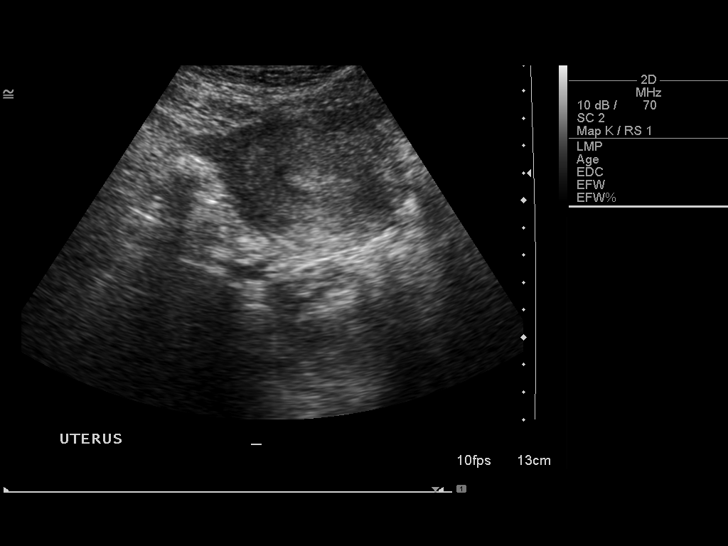
[im 12/63]
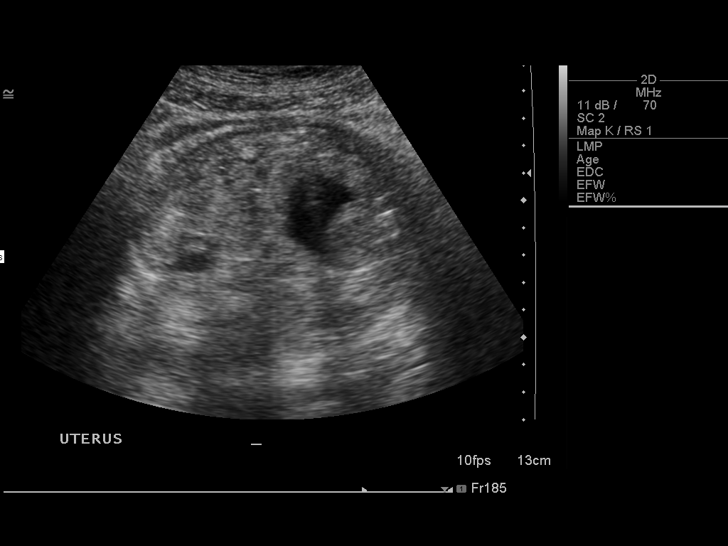
[im 17/63]
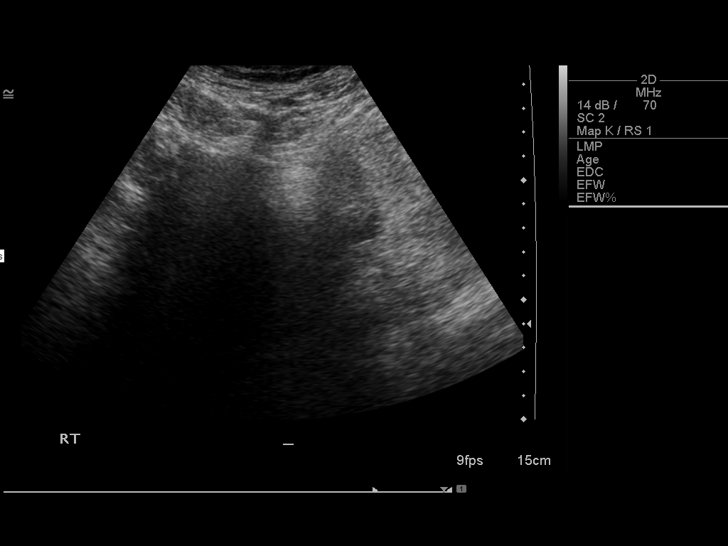
[im 21/63]
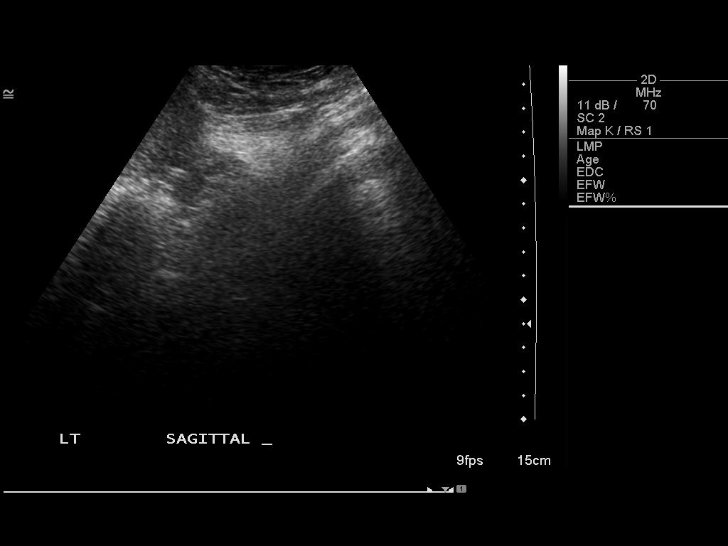
[im 26/63]
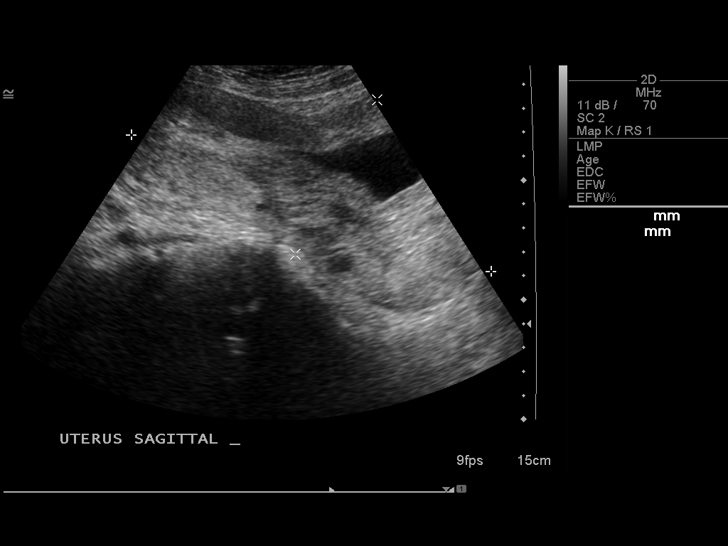
[im 30/63]
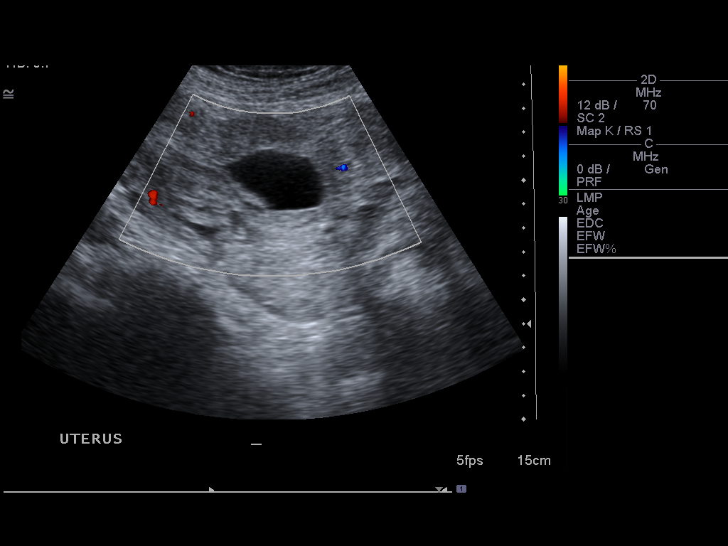
[im 35/63]
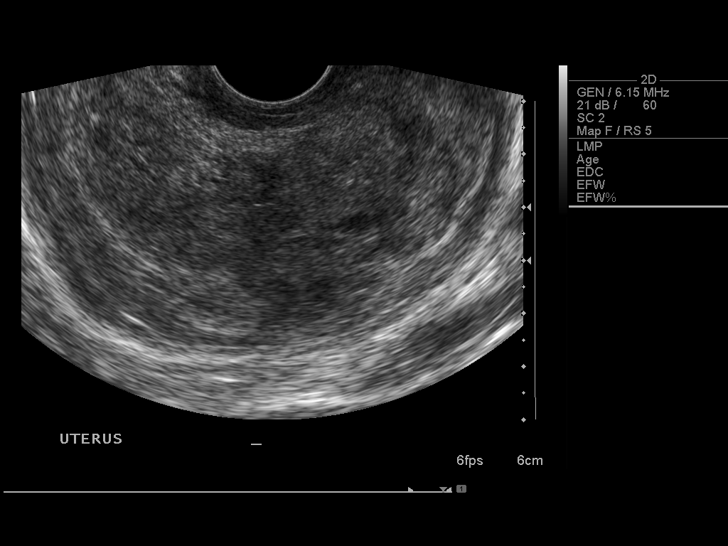
[im 40/63]
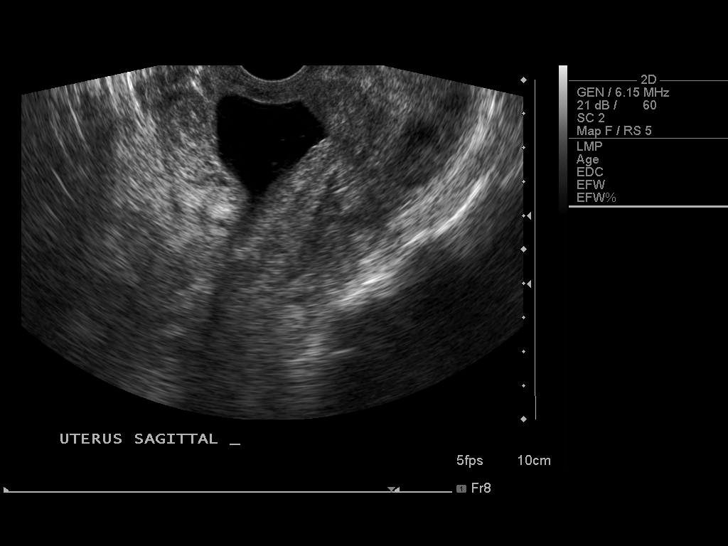
[im 44/63]
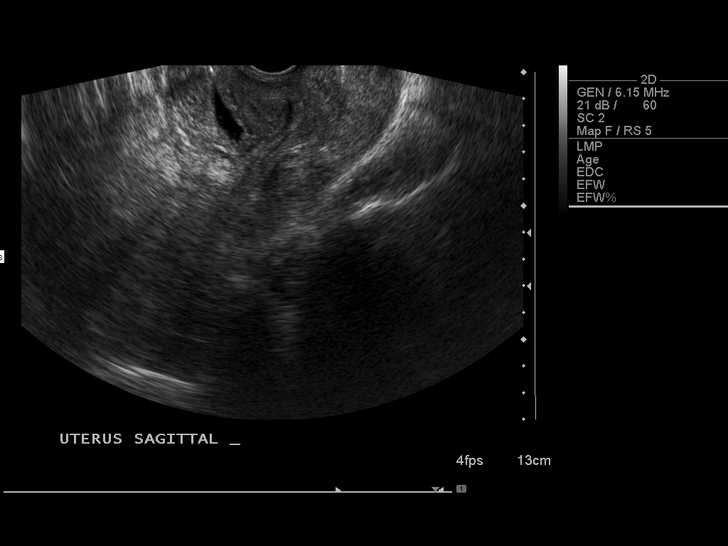
[im 49/63]
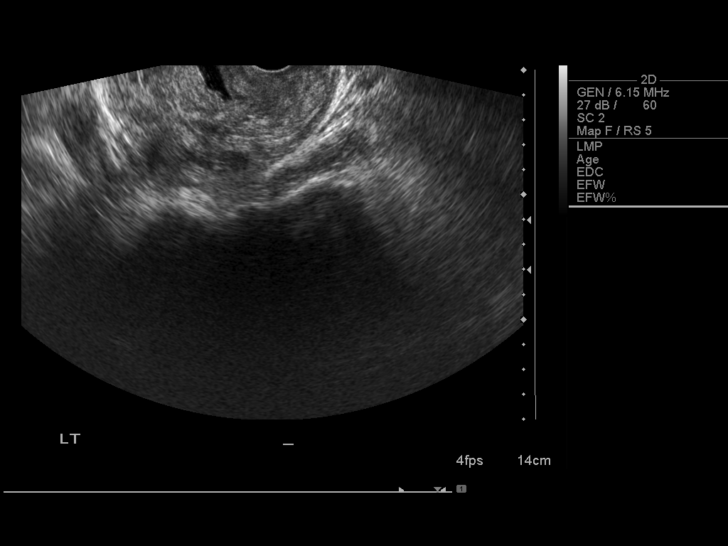
[im 53/63]
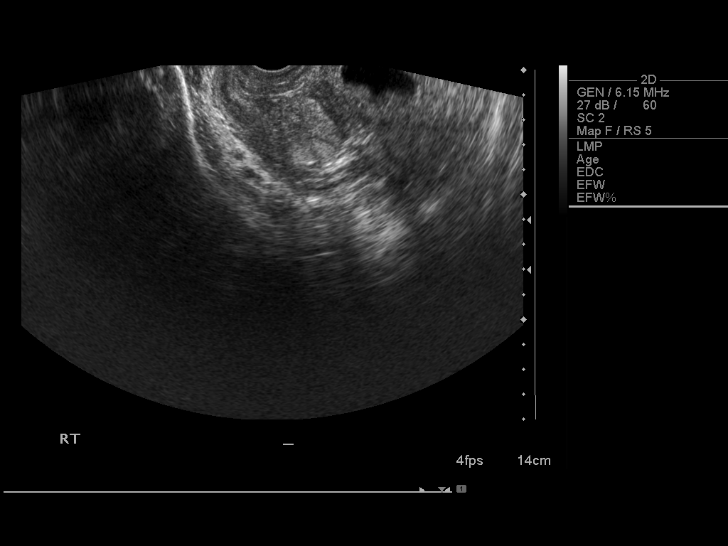
[im 58/63]
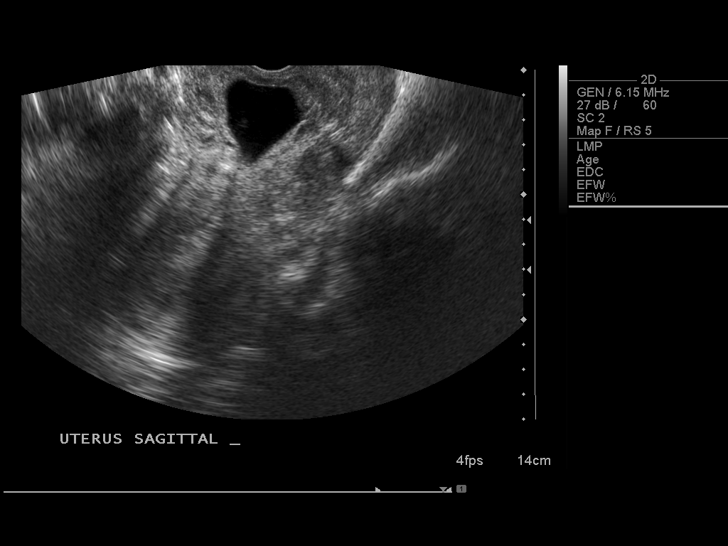
[im 63/63]
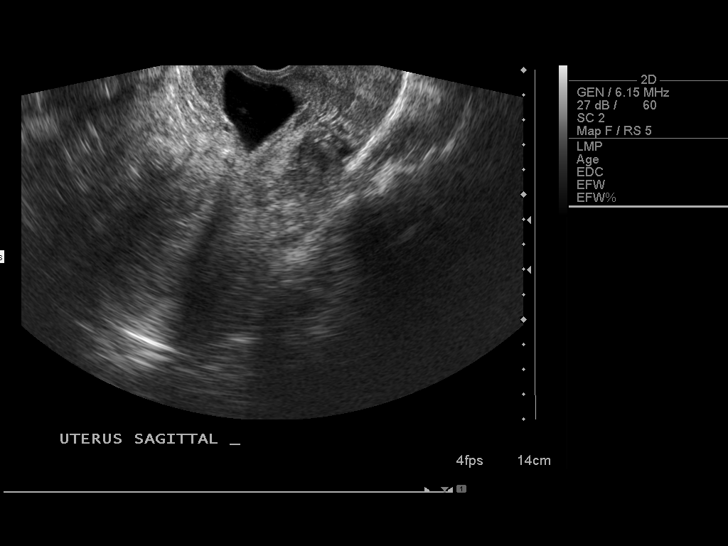

[14 of 28 positions shown; findings below may reference images not displayed]

Intrauterine gestational sac:  None
Yolk sac: None
Embryo: None
Cardiac Activity: None

Maternal uterus/adnexae:
The upper uterine endometrial canal appears thin and homogeneous.
The lower uterine canal contains fluid and heterogeneous debris.
The cervix appears opened.  No evidence for intrauterine
gestational sac, fetal pole, or yolk sac.  No adnexal mass
identified.  The ovaries are not seen on transabdominal or
endovaginal exam.  No free pelvic fluid.
IMPRESSION: Findings are most consistent with spontaneous abortion in progress.
There is no evidence for ectopic pregnancy.  Correlation with
quantitative beta HCG is recommended when it becomes available.

I discussed the findings with Stiff, Maria F at [DATE] a.m. on
05/07/2011.

## 2013-08-02 ENCOUNTER — Emergency Department (HOSPITAL_COMMUNITY)
Admission: EM | Admit: 2013-08-02 | Discharge: 2013-08-02 | Disposition: A | Discharge status: Home / Self Care | Payer: Medicaid Other | Attending: Emergency Medicine | Admitting: Emergency Medicine

## 2013-08-02 ENCOUNTER — Emergency Department (HOSPITAL_COMMUNITY): Payer: Medicaid Other

## 2013-08-02 ENCOUNTER — Encounter (HOSPITAL_COMMUNITY): Payer: Self-pay | Admitting: Emergency Medicine

## 2013-08-02 DIAGNOSIS — R110 Nausea: Secondary | ICD-10-CM | POA: Insufficient documentation

## 2013-08-02 DIAGNOSIS — R51 Headache: Secondary | ICD-10-CM | POA: Insufficient documentation

## 2013-08-02 DIAGNOSIS — Z87891 Personal history of nicotine dependence: Secondary | ICD-10-CM | POA: Insufficient documentation

## 2013-08-02 DIAGNOSIS — Z87442 Personal history of urinary calculi: Secondary | ICD-10-CM | POA: Insufficient documentation

## 2013-08-02 DIAGNOSIS — Z79899 Other long term (current) drug therapy: Secondary | ICD-10-CM | POA: Insufficient documentation

## 2013-08-02 DIAGNOSIS — K5900 Constipation, unspecified: Secondary | ICD-10-CM | POA: Insufficient documentation

## 2013-08-02 DIAGNOSIS — R42 Dizziness and giddiness: Secondary | ICD-10-CM | POA: Insufficient documentation

## 2013-08-02 DIAGNOSIS — N390 Urinary tract infection, site not specified: Secondary | ICD-10-CM

## 2013-08-02 DIAGNOSIS — B9689 Other specified bacterial agents as the cause of diseases classified elsewhere: Secondary | ICD-10-CM | POA: Insufficient documentation

## 2013-08-02 DIAGNOSIS — R109 Unspecified abdominal pain: Secondary | ICD-10-CM | POA: Insufficient documentation

## 2013-08-02 DIAGNOSIS — N898 Other specified noninflammatory disorders of vagina: Secondary | ICD-10-CM | POA: Insufficient documentation

## 2013-08-02 DIAGNOSIS — A499 Bacterial infection, unspecified: Secondary | ICD-10-CM | POA: Insufficient documentation

## 2013-08-02 DIAGNOSIS — Z3490 Encounter for supervision of normal pregnancy, unspecified, unspecified trimester: Secondary | ICD-10-CM

## 2013-08-02 DIAGNOSIS — N760 Acute vaginitis: Secondary | ICD-10-CM | POA: Insufficient documentation

## 2013-08-02 DIAGNOSIS — O2390 Unspecified genitourinary tract infection in pregnancy, unspecified trimester: Secondary | ICD-10-CM | POA: Insufficient documentation

## 2013-08-02 LAB — BASIC METABOLIC PANEL
BUN: 9 mg/dL (ref 6–23)
Chloride: 100 mEq/L (ref 96–112)
GFR calc Af Amer: >90 mL/min (ref >90)
Potassium: 3.9 mEq/L (ref 3.5–5.1)
Sodium: 132 mEq/L — ABNORMAL LOW (ref 135–145)

## 2013-08-02 LAB — URINALYSIS, ROUTINE W REFLEX MICROSCOPIC
Glucose, UA: NEGATIVE mg/dL
Ketones, ur: >80 mg/dL — AB
Protein, ur: 30 mg/dL — AB
pH: 5.5 (ref 5.0–8.0)

## 2013-08-02 LAB — WET PREP, GENITAL
Trich, Wet Prep: NONE SEEN
Yeast Wet Prep HPF POC: NONE SEEN

## 2013-08-02 LAB — POCT PREGNANCY, URINE: Preg Test, Ur: POSITIVE — AB

## 2013-08-02 LAB — CBC
HCT: 35.5 % — ABNORMAL LOW (ref 36.0–46.0)
RDW: 13.5 % (ref 11.5–15.5)
WBC: 9.7 10*3/uL (ref 4.0–10.5)

## 2013-08-02 LAB — URINE MICROSCOPIC-ADD ON

## 2013-08-02 MED ORDER — NITROFURANTOIN MONOHYD MACRO 100 MG PO CAPS: 100.0000 mg | ORAL_CAPSULE | Freq: Two times a day (BID) | ORAL | Status: DC

## 2013-08-02 MED ORDER — METRONIDAZOLE 500 MG PO TABS: 500.0000 mg | ORAL_TABLET | Freq: Two times a day (BID) | ORAL | Status: DC

## 2013-08-02 MED ORDER — ONDANSETRON HCL 4 MG PO TABS: 4.0000 mg | ORAL_TABLET | Freq: Four times a day (QID) | ORAL | Status: DC

## 2013-08-02 MED ORDER — PRENATAL COMPLETE 14-0.4 MG PO TABS: 1.0000 | ORAL_TABLET | Freq: Every day | ORAL | Status: DC

## 2013-08-02 NOTE — ED Notes (Signed)
US tech at bedside

## 2013-08-02 NOTE — ED Provider Notes (Signed)
CSN: [id number]     Arrival date & time 08/02/13  1459 History   First MD Initiated Contact with Patient 08/02/13 1508     No chief complaint on file.  (Consider location/radiation/quality/duration/timing/severity/associated sxs/prior Treatment) The history is provided by the patient. No language interpreter was used.  [name] [name] is a 32 y/o F with PMHx of migraines, kidney stones, chlamydia and gonorrhea infection presenting to the ED with nausea, dizziness, headache that has been ongoing for one week. Patient reported that she has been using Excedrin for her migraines with minimal relief. Patient reported that she has been using Zofran left over from her previous pregnancy with minimal relief. Patient reported that she has been having abdominal pain localized to the lower abdomen - described as a throbbing sensation, tightness mainly to the left side. Patient reported that she has been experiencing thick blood tinged discharge for one week everyday. LMP 06/03/2013. [id number]. Denied taking prenatal vitamins. Reported that she normally does not have a normal BM, normally has to use  Milk of Magnesia. Denied fever, chills, cough, congestion, neck pain, neck stiffness, back pain, abdominal cramping, chest pain, shortness of breath, difficulty breathing, vomiting.  PCP none  Past Medical History  Diagnosis Date  . Chlamydia   . Abnormal Pap smear   . Kidney stones   . Headache(784.0)     migraines  . Gonorrhea in pregnancy    Past Surgical History  Procedure Laterality Date  . Colposcopy    . Leep    . Dilation and evacuation  05/07/2011    Procedure: DILATATION AND EVACUATION (D&E);  Surgeon: Scheryl Darter, MD;  Location: [facility];  Service: Gynecology;  Laterality: N/A;   Family History  Problem Relation Age of Onset  . Cancer Maternal Grandmother   . Depression Maternal Grandmother   . Cancer Maternal Grandfather   . Depression Maternal Grandfather   . Cancer Paternal Grandmother   .  Depression Paternal Grandmother   . Cancer Paternal Grandfather   . Depression Paternal Grandfather    History  Substance Use Topics  . Smoking status: Former Smoker -- 0.25 packs/day    Types: Cigarettes    Quit date: 03/26/2012  . Smokeless tobacco: Not on file  . Alcohol Use: No   OB History   Grav Para Term Preterm Abortions TAB SAB Ect Mult Living   8 6 4 2 2  2   6      Review of Systems  Constitutional: Negative for fever and chills.  HENT: Negative for sore throat.   Respiratory: Negative for chest tightness and shortness of breath.   Cardiovascular: Negative for chest pain.  Gastrointestinal: Positive for nausea, abdominal pain and constipation (chronic ). Negative for vomiting.  Genitourinary: Positive for vaginal discharge. Negative for hematuria and vaginal pain.  Neurological: Positive for headaches. Negative for weakness.  All other systems reviewed and are negative.    Allergies  Review of patient's allergies indicates no known allergies.  Home Medications   Current Outpatient Rx  Name  Route  Sig  Dispense  Refill  . aspirin-acetaminophen-caffeine (EXCEDRIN MIGRAINE) 250-250-65 MG per tablet   Oral   Take 1 tablet by mouth every 6 (six) hours as needed for pain.         . metroNIDAZOLE (FLAGYL) 500 MG tablet   Oral   Take 1 tablet (500 mg total) by mouth 2 (two) times daily. One po bid x 7 days   14 tablet   0   .  nitrofurantoin, macrocrystal-monohydrate, (MACROBID) 100 MG capsule   Oral   Take 1 capsule (100 mg total) by mouth 2 (two) times daily.   14 capsule   0   . ondansetron (ZOFRAN) 4 MG tablet   Oral   Take 1 tablet (4 mg total) by mouth every 6 (six) hours.   12 tablet   0   . Prenatal Vit-Fe Fumarate-FA (PRENATAL COMPLETE) 14-0.4 MG TABS   Oral   Take 1 tablet by mouth daily.   60 each   0    BP 108/68  Pulse 78  Temp(Src) 98.3 F (36.8 C) (Oral)  Resp 18  SpO2 100%  LMP 05/07/2013 Physical Exam  Nursing note and  vitals reviewed. Constitutional: She is oriented to person, place, and time. She appears well-developed and well-nourished. No distress.  HENT:  Head: Normocephalic and atraumatic.  Mouth/Throat: Oropharynx is clear and moist. No oropharyngeal exudate.  Eyes: Conjunctivae and EOM are normal. Pupils are equal, round, and reactive to light. Right eye exhibits no discharge. Left eye exhibits no discharge.  Neck: Normal range of motion. Neck supple.  Negative neck stiffness Negative nuchal rigidity Negative cervical lymphadenopathy Negative pain upon palpation to the neck   Cardiovascular: Normal rate, regular rhythm and normal heart sounds.  Exam reveals no friction rub.   No murmur heard. Pulses:      Radial pulses are 2+ on the right side, and 2+ on the left side.       Dorsalis pedis pulses are 2+ on the right side, and 2+ on the left side.  Pulmonary/Chest: Effort normal and breath sounds normal. No respiratory distress. She has no wheezes. She has no rales.  Abdominal: Soft. Bowel sounds are normal. She exhibits no distension. There is tenderness.  Discomfort upon palpation to the lower abdomen - bilateral pelvic regions and suprapubic regions  Genitourinary: Vaginal discharge found.  Negative swelling, erythema, inflammation, lesions, sores noted to the external genitalia and vaginal canal. Negative blood in the vaginal vault. Thick white discharge identified. Negative findings on the cervix-unremarkable-no dilation. Mild CMT, mild bilateral adnexal tenderness. Pelvic exam chaperoned with Tech.   Musculoskeletal: Normal range of motion.  Lymphadenopathy:    She has no cervical adenopathy.  Neurological: She is alert and oriented to person, place, and time.  Skin: Skin is warm and dry. No rash noted. She is not diaphoretic. No erythema.  Psychiatric: She has a normal mood and affect. Her behavior is normal. Thought content normal.    ED Course  Procedures (including critical care  time)  7:54 PM This provider at bedside - discussed lab results and imaging in great detail. Discussed with patient the US findings. Discussed with patient plan for discharge. Discussed with patient what to watch out for regarding threatening symptoms to the pregnancy. Discussed with patient to follow-up with [facility] - stressed importance.   Labs Review Labs Reviewed  WET PREP, GENITAL - Abnormal; Notable for the following:    Clue Cells Wet Prep HPF POC MANY (*)    WBC, Wet Prep HPF POC FEW (*)    All other components within normal limits  CBC - Abnormal; Notable for the following:    HCT 35.5 (*)    All other components within normal limits  BASIC METABOLIC PANEL - Abnormal; Notable for the following:    Sodium 132 (*)    All other components within normal limits  URINALYSIS, ROUTINE W REFLEX MICROSCOPIC - Abnormal; Notable for the following:  Color, Urine AMBER (*)    APPearance TURBID (*)    Specific Gravity, Urine 1.031 (*)    Hgb urine dipstick TRACE (*)    Bilirubin Urine SMALL (*)    Ketones, ur >80 (*)    Protein, ur 30 (*)    Leukocytes, UA LARGE (*)    All other components within normal limits  HCG, QUANTITATIVE, PREGNANCY - Abnormal; Notable for the following:    hCG, Beta Chain, Quant, [place] [address] (*)    All other components within normal limits  URINE MICROSCOPIC-ADD ON - Abnormal; Notable for the following:    Squamous Epithelial / LPF MANY (*)    Bacteria, UA MANY (*)    All other components within normal limits  POCT PREGNANCY, URINE - Abnormal; Notable for the following:    Preg Test, Ur POSITIVE (*)    All other components within normal limits  GC/CHLAMYDIA PROBE AMP  URINE CULTURE   Imaging Review US Ob Comp Less 14 Wks  08/02/2013   CLINICAL DATA:  Rule out ectopic. Pelvic pain. Quantitative beta HCG is [id number].  EXAM: OBSTETRIC <14 WK [place] AND TRANSVAGINAL OB US  TECHNIQUE: Both transabdominal and transvaginal ultrasound examinations were performed  for complete evaluation of the gestation as well as the maternal uterus, adnexal regions, and pelvic cul-de-sac. Transvaginal technique was performed to assess early pregnancy.  COMPARISON:  None.  Ob ultrasound 05/07/2011  IMPRESSION: 1. Single living intrauterine embryo corresponding to age of 8 weeks 4 days. 2. EDC by today's exam is 03/10/2014. 3. No evidence for adnexal mass or ectopic pregnancy.  FINDINGS: Intrauterine gestational sac: Present  Yolk sac:  Present  Embryo:  Present  Cardiac Activity: Present  Heart Rate: 168 bpm  CRL: 2.02 scar site 20.2 mm 8 w 4 d [place] EDC: 03/10/2014  Maternal uterus/adnexae: The ovaries have a normal appearance. No subchorionic   Electronically Signed   By: Rosalie Gums M.D.   On: 08/02/2013 18:28   US Ob Transvaginal  08/02/2013   CLINICAL DATA:  Rule out ectopic. Pelvic pain. Quantitative beta HCG is [id number].  EXAM: OBSTETRIC <14 WK [place] AND TRANSVAGINAL OB US  TECHNIQUE: Both transabdominal and transvaginal ultrasound examinations were performed for complete evaluation of the gestation as well as the maternal uterus, adnexal regions, and pelvic cul-de-sac. Transvaginal technique was performed to assess early pregnancy.  COMPARISON:  None.  Ob ultrasound 05/07/2011  IMPRESSION: 1. Single living intrauterine embryo corresponding to age of 8 weeks 4 days. 2. EDC by today's exam is 03/10/2014. 3. No evidence for adnexal mass or ectopic pregnancy.  FINDINGS: Intrauterine gestational sac: Present  Yolk sac:  Present  Embryo:  Present  Cardiac Activity: Present  Heart Rate: 168 bpm  CRL: 2.02 scar site 20.2 mm 8 w 4 d [place] EDC: 03/10/2014  Maternal uterus/adnexae: The ovaries have a normal appearance. No subchorionic   Electronically Signed   By: Rosalie Gums M.D.   On: 08/02/2013 18:28    EKG Interpretation   None       MDM   1. UTI (urinary tract infection)   2. BV (bacterial vaginosis)   3. Pregnancy     Patient presenting to the ED with abdominal pain that started  one week ago with vaginal discharge that is blood tinged. Reported that she has been having dizziness and nausea ongoing for the past week. Denied chest pain, shortness of breath, difficulty breathing, vomiting.  Alert and oriented. BS normoactive in all quadrants, soft, discomfort  to the pelvic region bilaterally. Negative LAD. Urine pregnancy positive. CBC negative elevation white blood cell count. Urinalysis noted trace of hemoglobin, large leukocytes, pyuria with white blood cells and 21-50, many bacteria identified. Beta-hCG [id number]. Ultrasound noted single living intrauterine pregnancy approximately 8 weeks 4 days gestation. No evidence of adnexal mass or ectopic pregnancy. You'll sac present, cardiac activity noted with a heart rate 160 beats per minute. Many clue cells identified with your blood cells on wet prep. Patient presenting with pregnancy, BV, UTI. Negative ectopic pregnancy noted. Patient stable, afebrile. Discharged patient with zofran for nausea control, flagyl for BV and nitrofurantoin for UTI. Discharged patient with prenatal vitamins to take daily. Discussed with patient to rest and stay hydrated. Discussed with patient what to watch out for regarding threatening pregnancy - educated patient. Discussed with patient to continue to monitor symptoms and if symptoms are to worsen or change to report back to the ED - strict return instructions given.  Patient agreed to plan of care, understood, all questions answered.     Raymon Mutton, PA-C 08/03/13 1443

## 2013-08-02 NOTE — Progress Notes (Signed)
Patient confirms she does not have insurance as of yet but will have Medicaid soon since she is pregnant.  Patient confirmed that she does not have a pcp.  Patient sees Dr. Gaynell Face for ob care.  [facility] provided patient with a list ofo pcps who accept Medicaid insurance in [place].  Patient reports they have just moved to [facility], but want to have physicians in [place] area.  No further needs at this time.

## 2013-08-02 NOTE — ED Notes (Signed)
Pt c/o nausea, palpitations, migraine and sts probably is pregnant. Sts last period in August.

## 2013-08-03 LAB — URINE CULTURE

## 2013-08-03 NOTE — ED Provider Notes (Signed)
Medical screening examination/treatment/procedure(s) were performed by non-physician practitioner and as supervising physician I was immediately available for consultation/collaboration.  EKG Interpretation   None         Dagmar Hait, MD 08/03/13 2312

## 2013-09-27 ENCOUNTER — Encounter (HOSPITAL_COMMUNITY): Payer: Self-pay | Admitting: *Deleted

## 2013-09-27 ENCOUNTER — Inpatient Hospital Stay (HOSPITAL_COMMUNITY)
Admission: AD | Admit: 2013-09-27 | Discharge: 2013-09-29 | DRG: 770 | Disposition: A | Discharge status: Home / Self Care | Payer: Medicaid Other | Source: Ambulatory Visit | Attending: Obstetrics & Gynecology | Admitting: Obstetrics & Gynecology

## 2013-09-27 DIAGNOSIS — R109 Unspecified abdominal pain: Secondary | ICD-10-CM | POA: Diagnosis present

## 2013-09-27 DIAGNOSIS — O0289 Other abnormal products of conception: Secondary | ICD-10-CM | POA: Diagnosis present

## 2013-09-27 DIAGNOSIS — O034 Incomplete spontaneous abortion without complication: Principal | ICD-10-CM | POA: Diagnosis present

## 2013-09-27 NOTE — MAU Note (Signed)
Pt reports lower abd and lower back pain all day today, worsening now. Denies bleeding.

## 2013-09-28 ENCOUNTER — Encounter (HOSPITAL_COMMUNITY)
Admission: AD | Disposition: A | Discharge status: Home / Self Care | Payer: Self-pay | Source: Ambulatory Visit | Attending: Obstetrics & Gynecology

## 2013-09-28 ENCOUNTER — Encounter (HOSPITAL_COMMUNITY): Payer: Self-pay | Admitting: *Deleted

## 2013-09-28 ENCOUNTER — Encounter (HOSPITAL_COMMUNITY): Payer: Medicaid Other

## 2013-09-28 ENCOUNTER — Inpatient Hospital Stay (HOSPITAL_COMMUNITY): Payer: Medicaid Other

## 2013-09-28 DIAGNOSIS — O0289 Other abnormal products of conception: Secondary | ICD-10-CM | POA: Diagnosis present

## 2013-09-28 HISTORY — PX: DILATION AND CURETTAGE OF UTERUS: SHX78

## 2013-09-28 LAB — WET PREP, GENITAL: Yeast Wet Prep HPF POC: NONE SEEN

## 2013-09-28 LAB — CBC
HCT: 29.5 % — ABNORMAL LOW (ref 36.0–46.0)
Hemoglobin: 10.1 g/dL — ABNORMAL LOW (ref 12.0–15.0)
MCHC: 34.4 g/dL (ref 30.0–36.0)
MCHC: 34.2 g/dL (ref 30.0–36.0)
MCV: 88.9 fL (ref 78.0–100.0)
RDW: 14.1 % (ref 11.5–15.5)
RDW: 13.9 % (ref 11.5–15.5)
WBC: 19.8 10*3/uL — ABNORMAL HIGH (ref 4.0–10.5)

## 2013-09-28 LAB — URINALYSIS, ROUTINE W REFLEX MICROSCOPIC
Bilirubin Urine: NEGATIVE
Glucose, UA: NEGATIVE mg/dL
Ketones, ur: NEGATIVE mg/dL
Nitrite: NEGATIVE
Specific Gravity, Urine: >1.03 — ABNORMAL HIGH (ref 1.005–1.030)
pH: 6 (ref 5.0–8.0)

## 2013-09-28 LAB — DIC (DISSEMINATED INTRAVASCULAR COAGULATION)PANEL
Fibrinogen: 472 mg/dL (ref 204–475)
Prothrombin Time: 13.7 seconds (ref 11.6–15.2)
Smear Review: NONE SEEN

## 2013-09-28 LAB — POSTPARTUM HEMORRHAGE PROTOCOL (BB NOTIFICATION)

## 2013-09-28 LAB — URINE MICROSCOPIC-ADD ON

## 2013-09-28 LAB — DIC (DISSEMINATED INTRAVASCULAR COAGULATION) PANEL: D-Dimer, Quant: 0.9 ug/mL-FEU — ABNORMAL HIGH (ref 0.00–0.48)

## 2013-09-28 LAB — POCT FERN TEST: POCT Fern Test: POSITIVE

## 2013-09-28 LAB — GC/CHLAMYDIA PROBE AMP: GC Probe RNA: NEGATIVE

## 2013-09-28 LAB — AMNISURE RUPTURE OF MEMBRANE (ROM) NOT AT ARMC: Amnisure ROM: POSITIVE

## 2013-09-28 SURGERY — DILATION AND CURETTAGE
Anesthesia: General | Site: Vagina

## 2013-09-28 MED ORDER — LACTATED RINGERS IV SOLN
125.0000 mL/h | INTRAVENOUS | Status: DC
  Administered 2013-09-28: 125 mL/h via INTRAVENOUS

## 2013-09-28 MED ORDER — LACTATED RINGERS IV SOLN
INTRAVENOUS | Status: DC | PRN
  Administered 2013-09-28 (×3): via INTRAVENOUS

## 2013-09-28 MED ORDER — CALCIUM CARBONATE ANTACID 500 MG PO CHEW: 2.0000 | CHEWABLE_TABLET | ORAL | Status: DC | PRN

## 2013-09-28 MED ORDER — ETOMIDATE 2 MG/ML IV SOLN
INTRAVENOUS | Status: DC | PRN
  Administered 2013-09-28: 6 mg via INTRAVENOUS

## 2013-09-28 MED ORDER — IBUPROFEN 600 MG PO TABS
600.0000 mg | ORAL_TABLET | Freq: Four times a day (QID) | ORAL | Status: DC | PRN
  Administered 2013-09-29 (×2): 600 mg via ORAL
  Filled 2013-09-28 (×4): qty 1

## 2013-09-28 MED ORDER — KETOROLAC TROMETHAMINE 30 MG/ML IJ SOLN: 30.0000 mg | Freq: Once | INTRAMUSCULAR | Status: DC

## 2013-09-28 MED ORDER — MIDAZOLAM HCL 2 MG/2ML IJ SOLN: 0.5000 mg | Freq: Once | INTRAMUSCULAR | Status: DC | PRN

## 2013-09-28 MED ORDER — PHENYLEPHRINE 8 MG IN D5W 100 ML (0.08MG/ML) PREMIX OPTIME: INJECTION | INTRAVENOUS | Status: DC | PRN

## 2013-09-28 MED ORDER — SODIUM CHLORIDE 0.9 % IJ SOLN: 3.0000 mL | Freq: Two times a day (BID) | INTRAMUSCULAR | Status: DC

## 2013-09-28 MED ORDER — OXYCODONE-ACETAMINOPHEN 5-325 MG PO TABS: 1.0000 | ORAL_TABLET | ORAL | Status: DC | PRN

## 2013-09-28 MED ORDER — FENTANYL CITRATE 0.05 MG/ML IJ SOLN
INTRAMUSCULAR | Status: AC
  Filled 2013-09-28: qty 5

## 2013-09-28 MED ORDER — PROMETHAZINE HCL 25 MG/ML IJ SOLN: 6.2500 mg | INTRAMUSCULAR | Status: DC | PRN

## 2013-09-28 MED ORDER — OXYTOCIN 10 UNIT/ML IJ SOLN: 10.0000 [IU] | Freq: Once | INTRAMUSCULAR | Status: DC

## 2013-09-28 MED ORDER — FENTANYL CITRATE 0.05 MG/ML IJ SOLN
INTRAMUSCULAR | Status: DC | PRN
  Administered 2013-09-28 (×2): 25 ug via INTRAVENOUS

## 2013-09-28 MED ORDER — METHYLERGONOVINE MALEATE 0.2 MG/ML IJ SOLN
INTRAMUSCULAR | Status: DC | PRN
  Administered 2013-09-28: 0.2 mg via INTRAMUSCULAR

## 2013-09-28 MED ORDER — MISOPROSTOL 200 MCG PO TABS
ORAL_TABLET | ORAL | Status: AC
  Filled 2013-09-28: qty 3

## 2013-09-28 MED ORDER — PHENYLEPHRINE 8 MG IN D5W 100 ML (0.08MG/ML) PREMIX OPTIME
INJECTION | INTRAVENOUS | Status: DC | PRN
  Administered 2013-09-28: 60 ug/min via INTRAVENOUS

## 2013-09-28 MED ORDER — SIMETHICONE 80 MG PO CHEW: 80.0000 mg | CHEWABLE_TABLET | Freq: Four times a day (QID) | ORAL | Status: DC | PRN

## 2013-09-28 MED ORDER — MISOPROSTOL 200 MCG PO TABS
400.0000 ug | ORAL_TABLET | Freq: Once | ORAL | Status: AC
  Administered 2013-09-28: 400 ug via VAGINAL

## 2013-09-28 MED ORDER — MEPERIDINE HCL 25 MG/ML IJ SOLN: 6.2500 mg | INTRAMUSCULAR | Status: DC | PRN

## 2013-09-28 MED ORDER — MENTHOL 3 MG MT LOZG: 1.0000 | LOZENGE | OROMUCOSAL | Status: DC | PRN

## 2013-09-28 MED ORDER — MIDAZOLAM HCL 2 MG/2ML IJ SOLN
INTRAMUSCULAR | Status: AC
  Filled 2013-09-28: qty 2

## 2013-09-28 MED ORDER — ACETAMINOPHEN 500 MG PO TABS
1000.0000 mg | ORAL_TABLET | Freq: Once | ORAL | Status: AC
  Administered 2013-09-28: 1000 mg via ORAL
  Filled 2013-09-28: qty 2

## 2013-09-28 MED ORDER — SUCCINYLCHOLINE CHLORIDE 20 MG/ML IJ SOLN
INTRAMUSCULAR | Status: AC
  Filled 2013-09-28: qty 10

## 2013-09-28 MED ORDER — ETOMIDATE 2 MG/ML IV SOLN
INTRAVENOUS | Status: AC
  Filled 2013-09-28: qty 10

## 2013-09-28 MED ORDER — PANTOPRAZOLE SODIUM 40 MG PO TBEC
40.0000 mg | DELAYED_RELEASE_TABLET | Freq: Every day | ORAL | Status: DC
  Administered 2013-09-29: 40 mg via ORAL
  Filled 2013-09-28: qty 1

## 2013-09-28 MED ORDER — ZOLPIDEM TARTRATE 5 MG PO TABS: 5.0000 mg | ORAL_TABLET | Freq: Every evening | ORAL | Status: DC | PRN

## 2013-09-28 MED ORDER — HYDROMORPHONE HCL PF 1 MG/ML IJ SOLN: 0.2000 mg | INTRAMUSCULAR | Status: DC | PRN

## 2013-09-28 MED ORDER — ONDANSETRON HCL 4 MG/2ML IJ SOLN
INTRAMUSCULAR | Status: DC | PRN
  Administered 2013-09-28: 4 mg via INTRAVENOUS

## 2013-09-28 MED ORDER — INFLUENZA VAC SPLIT QUAD 0.5 ML IM SUSP
0.5000 mL | INTRAMUSCULAR | Status: DC
  Filled 2013-09-28: qty 0.5

## 2013-09-28 MED ORDER — PNEUMOCOCCAL VAC POLYVALENT 25 MCG/0.5ML IJ INJ
0.5000 mL | INJECTION | INTRAMUSCULAR | Status: AC
  Administered 2013-09-29: 0.5 mL via INTRAMUSCULAR
  Filled 2013-09-28: qty 0.5

## 2013-09-28 MED ORDER — FENTANYL CITRATE 0.05 MG/ML IJ SOLN
50.0000 ug | INTRAMUSCULAR | Status: DC | PRN
  Administered 2013-09-28: 100 ug via INTRAVENOUS
  Filled 2013-09-28 (×2): qty 2

## 2013-09-28 MED ORDER — SODIUM CHLORIDE 0.9 % IJ SOLN: 3.0000 mL | INTRAMUSCULAR | Status: DC | PRN

## 2013-09-28 MED ORDER — MISOPROSTOL 200 MCG PO TABS
600.0000 ug | ORAL_TABLET | Freq: Once | ORAL | Status: AC
  Administered 2013-09-28: 600 ug via VAGINAL
  Filled 2013-09-28: qty 3

## 2013-09-28 MED ORDER — MISOPROSTOL 100 MCG PO TABS
ORAL_TABLET | ORAL | Status: DC | PRN
  Administered 2013-09-28: 600 ug via RECTAL

## 2013-09-28 MED ORDER — SODIUM CHLORIDE 0.9 % IV SOLN: 250.0000 mL | INTRAVENOUS | Status: DC | PRN

## 2013-09-28 MED ORDER — OXYCODONE-ACETAMINOPHEN 5-325 MG PO TABS
2.0000 | ORAL_TABLET | Freq: Four times a day (QID) | ORAL | Status: DC | PRN
  Administered 2013-09-28: 2 via ORAL
  Filled 2013-09-28: qty 2

## 2013-09-28 MED ORDER — FENTANYL CITRATE 0.05 MG/ML IJ SOLN: 25.0000 ug | INTRAMUSCULAR | Status: DC | PRN

## 2013-09-28 MED ORDER — OXYTOCIN 10 UNIT/ML IJ SOLN
INTRAMUSCULAR | Status: AC
  Filled 2013-09-28: qty 1

## 2013-09-28 MED ORDER — BUPIVACAINE HCL 0.5 % IJ SOLN
INTRAMUSCULAR | Status: DC | PRN
  Administered 2013-09-28: 30 mL

## 2013-09-28 MED ORDER — LORAZEPAM 1 MG PO TABS
1.0000 mg | ORAL_TABLET | ORAL | Status: DC | PRN
  Administered 2013-09-28: 1 mg via ORAL
  Filled 2013-09-28: qty 1

## 2013-09-28 MED ORDER — MISOPROSTOL 200 MCG PO TABS
400.0000 ug | ORAL_TABLET | Freq: Once | ORAL | Status: DC
  Filled 2013-09-28: qty 2

## 2013-09-28 MED ORDER — DOXYCYCLINE HYCLATE 100 MG PO TABS
100.0000 mg | ORAL_TABLET | Freq: Two times a day (BID) | ORAL | Status: DC
  Administered 2013-09-28 – 2013-09-29 (×2): 100 mg via ORAL
  Filled 2013-09-28 (×2): qty 1

## 2013-09-28 MED ORDER — SUCCINYLCHOLINE CHLORIDE 20 MG/ML IJ SOLN
INTRAMUSCULAR | Status: DC | PRN
  Administered 2013-09-28: 100 mg via INTRAVENOUS

## 2013-09-28 MED ORDER — MISOPROSTOL 200 MCG PO TABS
400.0000 ug | ORAL_TABLET | ORAL | Status: DC
  Administered 2013-09-28: 400 ug via VAGINAL
  Filled 2013-09-28 (×2): qty 2

## 2013-09-28 MED ORDER — BUPIVACAINE HCL (PF) 0.5 % IJ SOLN
INTRAMUSCULAR | Status: AC
  Filled 2013-09-28: qty 30

## 2013-09-28 MED ORDER — ALBUMIN HUMAN 5 % IV SOLN
12.5000 g | Freq: Once | INTRAVENOUS | Status: AC
  Administered 2013-09-28: 12.5 g via INTRAVENOUS
  Filled 2013-09-28: qty 250

## 2013-09-28 MED ORDER — LACTATED RINGERS IV SOLN
INTRAVENOUS | Status: DC | PRN
  Administered 2013-09-28 (×2): via INTRAVENOUS

## 2013-09-28 MED ORDER — ONDANSETRON HCL 4 MG/2ML IJ SOLN
INTRAMUSCULAR | Status: AC
  Filled 2013-09-28: qty 2

## 2013-09-28 MED ORDER — PHENYLEPHRINE HCL 10 MG/ML IJ SOLN
INTRAMUSCULAR | Status: DC | PRN
  Administered 2013-09-28: 40 mg via INTRAVENOUS
  Administered 2013-09-28: 80 ug via INTRAVENOUS
  Administered 2013-09-28: 80 mg via INTRAVENOUS
  Administered 2013-09-28 (×2): 80 ug via INTRAVENOUS

## 2013-09-28 MED ORDER — ONDANSETRON HCL 4 MG/2ML IJ SOLN
4.0000 mg | Freq: Four times a day (QID) | INTRAMUSCULAR | Status: DC | PRN
  Administered 2013-09-28: 4 mg via INTRAVENOUS
  Filled 2013-09-28: qty 2

## 2013-09-28 MED ORDER — DOCUSATE SODIUM 100 MG PO CAPS
100.0000 mg | ORAL_CAPSULE | Freq: Two times a day (BID) | ORAL | Status: DC
  Administered 2013-09-28 – 2013-09-29 (×2): 100 mg via ORAL
  Filled 2013-09-28 (×2): qty 1

## 2013-09-28 MED ORDER — PROPOFOL 10 MG/ML IV EMUL
INTRAVENOUS | Status: AC
  Filled 2013-09-28: qty 20

## 2013-09-28 MED ORDER — ACETAMINOPHEN 325 MG PO TABS
650.0000 mg | ORAL_TABLET | ORAL | Status: DC | PRN
  Administered 2013-09-28: 650 mg via ORAL
  Filled 2013-09-28: qty 2

## 2013-09-28 MED ORDER — PRENATAL MULTIVITAMIN CH: 1.0000 | ORAL_TABLET | Freq: Every day | ORAL | Status: DC

## 2013-09-28 MED ORDER — ONDANSETRON HCL 4 MG PO TABS: 4.0000 mg | ORAL_TABLET | Freq: Four times a day (QID) | ORAL | Status: DC | PRN

## 2013-09-28 MED ORDER — LACTATED RINGERS IV SOLN
INTRAVENOUS | Status: DC
  Administered 2013-09-29: 02:00:00 via INTRAVENOUS

## 2013-09-28 SURGICAL SUPPLY — 16 items
CATH ROBINSON RED A/P 16FR (CATHETERS) ×2 IMPLANT
CLOTH BEACON ORANGE TIMEOUT ST (SAFETY) ×2 IMPLANT
CONTAINER PREFILL 10% NBF 60ML (FORM) ×4 IMPLANT
DECANTER SPIKE VIAL GLASS SM (MISCELLANEOUS) ×2 IMPLANT
DRSG TELFA 3X8 NADH (GAUZE/BANDAGES/DRESSINGS) ×2 IMPLANT
GLOVE BIO SURGEON STRL SZ7 (GLOVE) ×2 IMPLANT
GLOVE BIOGEL PI IND STRL 7.0 (GLOVE) ×1 IMPLANT
GLOVE BIOGEL PI INDICATOR 7.0 (GLOVE) ×1
GOWN STRL REIN XL XLG (GOWN DISPOSABLE) ×4 IMPLANT
NEEDLE SPNL 22GX3.5 QUINCKE BK (NEEDLE) ×2 IMPLANT
PACK VAGINAL MINOR WOMEN LF (CUSTOM PROCEDURE TRAY) ×2 IMPLANT
PAD OB MATERNITY 4.3X12.25 (PERSONAL CARE ITEMS) ×2 IMPLANT
PAD PREP 24X48 CUFFED NSTRL (MISCELLANEOUS) ×2 IMPLANT
SYR CONTROL 10ML LL (SYRINGE) ×2 IMPLANT
TOWEL OR 17X24 6PK STRL BLUE (TOWEL DISPOSABLE) ×4 IMPLANT
WATER STERILE IRR 1000ML POUR (IV SOLUTION) ×2 IMPLANT

## 2013-09-28 NOTE — MAU Note (Signed)
Pt concerned that she may be leaking fluid. Yellow fluid noted on the pad.  Fluid looks like urine.

## 2013-09-28 NOTE — Progress Notes (Signed)
Faculty Practice OB/GYN Attending Note  Patient had delivery of nonviable fetus and had some bleeding which quickly increased to a Stage II hemorrhage.  Code Hemorrhage activated.  On exam, blood noted on bed and all over patient, about 1200 ml.  Placenta was noted to be very fundal, not delivering.  OR called, will go down for urgent D&E under ultrasound guidance.    Jaynie Collins, MD, FACOG Attending Obstetrician & Gynecologist Faculty Practice, [facility]

## 2013-09-28 NOTE — Anesthesia Preprocedure Evaluation (Signed)
Anesthesia Evaluation  Patient identified by MRN, date of birth, ID band Patient awake    Reviewed: Allergy & Precautions, H&P , Patient's Chart, lab work & pertinent test results, reviewed documented beta blocker date and time   History of Anesthesia Complications Negative for: history of anesthetic complications  Airway Mallampati: III TM Distance: >3 FB Neck ROM: full    Dental   Pulmonary former smoker,  breath sounds clear to auscultation        Cardiovascular Exercise Tolerance: Good Rhythm:regular Rate:Normal     Neuro/Psych  Headaches,    GI/Hepatic   Endo/Other    Renal/GU Renal disease     Musculoskeletal   Abdominal   Peds  Hematology   Anesthesia Other Findings Severe hemmorrage  Reproductive/Obstetrics                           Anesthesia Physical Anesthesia Plan  ASA: III and emergent  Anesthesia Plan: General ETT   Post-op Pain Management:    Induction: Rapid sequence, Cricoid pressure planned and Intravenous  Airway Management Planned:   Additional Equipment:   Intra-op Plan:   Post-operative Plan:   Informed Consent: I have reviewed the patients History and Physical, chart, labs and discussed the procedure including the risks, benefits and alternatives for the proposed anesthesia with the patient or authorized representative who has indicated his/her understanding and acceptance.   Dental Advisory Given  Plan Discussed with: CRNA and Surgeon  Anesthesia Plan Comments:         Anesthesia Quick Evaluation

## 2013-09-28 NOTE — Progress Notes (Signed)
Second unit of blood completed, no reaction noted at present

## 2013-09-28 NOTE — Progress Notes (Addendum)
Patient had vaginal delivery of non-viable female fetus at 63. Fetus had signs of life at birth, cord clamped and patient cut cord and held infant skin to skin on her chest. Pictures were taken of this per her request. IV fluids going in to gravity.Patient cramping and passing large amount of clots, Dr.Anyanwu notified of delivery and clots. 400mg  Cytotec given po. Patient continued to have increased amount of bleeding, Dr.Anyanwu notified of this, on her way. Patient began feeling nauseous and like she was going to pass out, hypotensive, code hemorrhage called. Dr.Anyanwu and hemorrhage team at bedside. 2nd IV started with fluids infusing. Decision made to take patient for [facility], support person Joya Salm at bedside signed consent. Patient with decreased LOC. Taken to OR.  Fetus time of death 13.

## 2013-09-28 NOTE — Transfer of Care (Signed)
Immediate Anesthesia Transfer of Care Note  Patient: [name] [name]  Procedure(s) Performed: Procedure(s): DILATATION AND EVACUATION FOR RETAINED PLACENTA (N/A)  Patient Location: PACU  Anesthesia Type:General  Level of Consciousness: awake, alert  and oriented  Airway & Oxygen Therapy: Patient Spontanous Breathing and Patient connected to nasal cannula oxygen  Post-op Assessment: Report given to PACU RN and Post -op Vital signs reviewed and stable  Post vital signs: Reviewed and stable  Complications: No apparent anesthesia complications
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
## 2013-09-28 NOTE — Anesthesia Postprocedure Evaluation (Signed)
Anesthesia Post Note  Patient: [name] [name]  Procedure(s) Performed: Procedure(s) (LRB): DILATATION AND EVACUATION FOR RETAINED PLACENTA (N/A)  Anesthesia type: General  Patient location: PACU  Post pain: Pain level controlled  Post assessment: Post-op Vital signs reviewed  Last Vitals:  Filed Vitals:   09/28/13 1928  BP: 87/58  Pulse: 99  Temp:   Resp: 20    Post vital signs: Reviewed  Level of consciousness: sedated  Complications: No apparent anesthesia complications

## 2013-09-28 NOTE — Progress Notes (Signed)
Dr Arby Barrette called to update on pts condition discussed low bp and volume status, pt more alert and communicative, okay to transfer pt to aicu

## 2013-09-28 NOTE — Anesthesia Procedure Notes (Signed)
Procedure Name: Intubation Date/Time: 09/28/2013 5:48 PM Performed by: Allie Bossier Pre-anesthesia Checklist: Patient identified, Emergency Drugs available, Suction available, Patient being monitored and Timeout performed Patient Re-evaluated:Patient Re-evaluated prior to inductionOxygen Delivery Method: Circle system utilized Preoxygenation: Pre-oxygenation with 100% oxygen Intubation Type: IV induction, Cricoid Pressure applied and Rapid sequence Laryngoscope Size: Miller and 2 Grade View: Grade I Tube type: Oral Tube size: 7.0 mm Number of attempts: 1 Airway Equipment and Method: Stylet Placement Confirmation: ETT inserted through vocal cords under direct vision,  positive ETCO2,  CO2 detector and breath sounds checked- equal and bilateral Secured at: 22 cm Tube secured with: Tape Dental Injury: Teeth and Oropharynx as per pre-operative assessment

## 2013-09-28 NOTE — Progress Notes (Signed)
09/28/13 1700  Clinical Encounter Type  Visited With Patient;Health care provider  Visit Type Spiritual support;Social support (pastoral support through delivery)  Spiritual Encounters  Spiritual Needs Emotional;Prayer;Ritual (provided support through delivery; baptized baby)   Checked on [name] [name] through the afternoon, and provided support through the delivery of her baby, [name] [name].  Provided prayer and baptism per pt desire.  [name] [name] was very Adult nurse.    During the course of the afternoon, she had several conversations with a support person from [facility].  Per pt, this person plans to cover cremation costs with [name] [name].  [name] [name] also verbalized plans to utilize counseling services through [facility].  At this time she desires private time with her baby.  She is aware of chaplain availability overnight; please page [phone] if needed.  Thank you!  [address] [place], [place] [phone]

## 2013-09-28 NOTE — Progress Notes (Addendum)
Pt resting, calmer after medication - remains tearful but not crying at this time.  Discussed plan of care - pt requests to wait for cytotec induction "for a while", wants to see what she will do on her own.  Discussed inc chance of infection with PROM but pt states "just not ready right now".  Lights dimmed and pt resting - waiting for friend to come be with her.  Report to H. Mathews Robinsons, CNM re pts request to wait for cytotec and for friend to come to hospital.

## 2013-09-28 NOTE — Op Note (Signed)
[name] [name] PROCEDURE DATE: 09/28/2013  PREOPERATIVE DIAGNOSIS: Retained placenta POSTOPERATIVE DIAGNOSIS: The same PROCEDURE:   Dilation and Curettage under ultrasound guidance SURGEON:  Dr. Jaynie Collins  INDICATIONS: 32 y.o. [id number] with retained placenta and hemorrhage after SVD at [redacted]w[redacted]d in the setting of PPROM, needing emergent surgical completion. Code Hemorrhage was activated for patient.  Risks of surgery were discussed with the patient and her friend including but not limited to: bleeding which may require transfusion; infection which may require antibiotics; injury to uterus or surrounding organs; need for additional procedures including laparotomy or laparoscopy; possibility of intrauterine scarring which may impair future fertility; and other postoperative/anesthesia complications. Written informed consent was obtained from the friend as patient was very lethargic and was unable to give written consent; she did give verbal consent.    FINDINGS:  Fundal placenta with significant clots and blood.  Total EBL about 2 liters.    ANESTHESIA:    General, paracervical block with 30 ml of 0.5% Marcaine INTRAVENOUS FLUIDS:  1000 ml of LR ESTIMATED BLOOD LOSS:  2000 ml since the beginning of the post delivery hemorrhage. SPECIMENS:  Placental fragments sent to pathology COMPLICATIONS:  None immediate.  PROCEDURE DETAILS:  The patient was then taken to the operating room where general anesthesia was administered and was found to be adequate.  After an adequate timeout was performed, she was placed in the dorsal lithotomy position and examined; then prepped and draped in the sterile manner.   A vaginal speculum was then placed in the patient's vagina and a ring forceps was applied to the anterior lip of the cervix.  The cervix was already about 3 cm dilated; forceps were advanced into the uterus to grasp and remove the placenta in fragments under ultrasound guidance.  A 14 mm suction curette was  then advanced into the uterus, the suction device was then activated and curette slowly rotated to clear further placental fragments.  A sharp curettage was then performed to confirm complete emptying of the uterus. There was significant bleeding noted so IM Methergine and PR Misoprostol 600 mcg was administered.   The bleeding was noted to subside significantly, and a  paracervical block using 30 ml of 0.5% Marcaine was administered.  All instruments were removed from the patient's vagina.  Sponge and instrument counts were correct times two.  The patient tolerated the procedure well and was taken to the recovery area awake, extubated and in stable condition.  Given the amount of blood loss, patient will be observed overnight in the hospital and will receive blood transfusions as indicated.  Will discharge in the morning if she remains stable.     Jaynie Collins, MD, FACOG Attending Obstetrician & Gynecologist Faculty Practice, [facility] of [place]

## 2013-09-28 NOTE — Progress Notes (Signed)
Faculty Practice OB/GYN Attending Note  Subjective:  Patient is tearful, no other concerns.  Her friend is here with her, she gave verbal consent to discuss her health information in front of her friend.    Admitted on 09/27/2013 for Preterm premature rupture of membranes in second trimester at [redacted]w[redacted]d    Objective:  Blood pressure 93/55, pulse 91, temperature 98.4 F (36.9 C), temperature source Oral, resp. rate 22, height 5\' 4"  (1.626 m), weight 180 lb (81.647 kg), last menstrual period 05/07/2013, SpO2 97.00%. Physical exam by her RN Brayton El Gen: NAD Abdomen: NT, soft Cervix: Deferred Ext: 2+ DTRs, no edema, no cyanosis, negative Homan's sign  Assessment & Plan:  32 y.o. [id number] at [redacted]w[redacted]d admitted for PPROM. Discussed management of previable PPROM: expectant management vs misoprostol administration vs D&E.  Risks and benefits of all modalities discussed; all questions answered.  Patient declines D&E, but is undecided between expectant management vs misoprostol administration.  She will consider these options and let us know what she decides.  Until then, will continue inpatient expectant management.  Bleeding precautions reviewed.  Continue AICU care.     Jaynie Collins, MD, FACOG Attending Obstetrician & Gynecologist Faculty Practice, [facility] of [place]

## 2013-09-28 NOTE — Progress Notes (Signed)
New admit - pt rec'd via stretcher from MAU for cytotec induction of labor.  PROM, 12/22 approx 2200.  Pt came to hospital for evaluation cramping and back discomfort all day, increased intensity.  Denies vaginal bleeding.  No IV on admission.  Pt crying, inconsolable on admission.  Multiple questions concerning viability of baby, need for induction.  Talked at length with pt re plan of care.  Still very upset but not crying as hard at this time.  Given cold washcloth and lights dimmed.  Pt waiting for friend to come stay with her, requests "a little time to think".
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
## 2013-09-28 NOTE — Progress Notes (Signed)
Faculty Practice OB/GYN Attending Note  I was called by Brayton El, RN to inform me that patient has decided to proceed with misoprostol administration.  Patient is aware of risks of retained placenta which may lead to hemorrhage and possible D&C.  Misoprostol 600 mcg given vaginally at 1110, will follow with 400 mcg pv every 4 hours until delivery.  Fentanyl and Percocet ordered as needed for pain.   Will continue close observation in the AICU.   Jaynie Collins, MD, FACOG Attending Obstetrician & Gynecologist Faculty Practice, [facility] of [place]

## 2013-09-29 ENCOUNTER — Encounter (HOSPITAL_COMMUNITY): Payer: Self-pay | Admitting: Obstetrics & Gynecology

## 2013-09-29 LAB — TYPE AND SCREEN
ABO/RH(D): O POS
Antibody Screen: NEGATIVE
Unit division: 0
Unit division: 0

## 2013-09-29 LAB — CBC
Hemoglobin: 9 g/dL — ABNORMAL LOW (ref 12.0–15.0)
MCH: 30.4 pg (ref 26.0–34.0)
MCHC: 34.7 g/dL (ref 30.0–36.0)
MCV: 87.5 fL (ref 78.0–100.0)
Platelets: 146 10*3/uL — ABNORMAL LOW (ref 150–400)
RBC: 2.96 MIL/uL — ABNORMAL LOW (ref 3.87–5.11)

## 2013-09-29 MED ORDER — IBUPROFEN 600 MG PO TABS: 600.0000 mg | ORAL_TABLET | Freq: Four times a day (QID) | ORAL | Status: DC | PRN

## 2013-09-29 MED ORDER — OXYCODONE-ACETAMINOPHEN 5-325 MG PO TABS: 1.0000 | ORAL_TABLET | ORAL | Status: DC | PRN

## 2013-09-29 NOTE — Progress Notes (Signed)
Patient given discharge instructions and follow up appt. information.  She verbalizes understanding.  Patient sent home with her sister and brother- in- Social worker.  Patient accompanied by Ferdinand Lango RN and Marchelle Folks NT to leave fetus in morgue.  Patient very tearful.  Emotional support provided.

## 2013-09-29 NOTE — Anesthesia Postprocedure Evaluation (Signed)
Anesthesia Post Note  Patient: [name] [name]  Procedure(s) Performed: Procedure(s) (LRB): DILATATION AND EVACUATION FOR RETAINED PLACENTA (N/A)  Anesthesia type: General  Patient location: Mother/Baby  Post pain: Pain level controlled  Post assessment: Post-op Vital signs reviewed  Last Vitals:  Filed Vitals:   09/29/13 0747  BP: 83/57  Pulse: 85  Temp:   Resp:     Post vital signs: Reviewed  Level of consciousness: awake and alert   Complications: No apparent anesthesia complications

## 2013-09-29 NOTE — Discharge Summary (Signed)
Physician Discharge Summary  Patient ID: [name] [name] MRN: [id number] DOB/AGE: [birth year] 32 y.o.  Admit date: 09/27/2013 Discharge date: 09/29/2013  Admission Diagnoses:   PPROM at [redacted]w[redacted]d  Discharge Diagnoses:  Principal Problem:   Retained placenta with hemorrhage needing D&C Active Problems:   Preterm premature rupture of membranes in second trimester, delivered   Previable pregnancy  Discharged Condition: stable  Hospital Course: Patient was admitted after PPROM at [redacted]w[redacted]d.  She was admitted and received misoprostol that resulted in delivery of a previable 16 week infant.  Immediately after delivery, she started to have hemorrhage; Code Hemorrhage was activated and she was taken to the OR emergently for D&E and removal of retained placenta under ultrasound guidance.  Patient had an uncomplicated surgery; for further details of this surgery, please refer to the operative note. She did receive 2 units of pRBCs.  She had an uncomplicated postoperative course.  By time of discharge, her pain was controlled on oral pain medications; she was ambulating, voiding without difficulty, tolerating regular diet and passing flatus.  She was deemed stable for discharge to home.  Patient was seen by [facility] and appropriate support was given to her.  Significant Diagnostic Studies:  Results for orders placed during the hospital encounter of 09/27/13 (from the past 48 hour(s))  URINALYSIS, ROUTINE W REFLEX MICROSCOPIC     Status: Abnormal   Collection Time    09/27/13 11:50 PM      Result Value Range   Color, Urine YELLOW  YELLOW   APPearance CLOUDY (*) CLEAR   Specific Gravity, Urine >1.030 (*) 1.005 - 1.030   pH 6.0  5.0 - 8.0   Glucose, UA NEGATIVE  NEGATIVE mg/dL   Hgb urine dipstick LARGE (*) NEGATIVE   Bilirubin Urine NEGATIVE  NEGATIVE   Ketones, ur NEGATIVE  NEGATIVE mg/dL   Protein, ur 30 (*) NEGATIVE mg/dL   Urobilinogen, UA 0.2  0.0 - 1.0 mg/dL   Nitrite NEGATIVE  NEGATIVE    Leukocytes, UA SMALL (*) NEGATIVE  URINE MICROSCOPIC-ADD ON     Status: Abnormal   Collection Time    09/27/13 11:50 PM      Result Value Range   Squamous Epithelial / LPF RARE  RARE   WBC, UA 0-2  <3 WBC/hpf   RBC / HPF 3-6  <3 RBC/hpf   Bacteria, UA FEW (*) RARE  GC/CHLAMYDIA PROBE AMP     Status: None   Collection Time    09/28/13  1:15 AM      Result Value Range   CT Probe RNA NEGATIVE  NEGATIVE   GC Probe RNA NEGATIVE  NEGATIVE   Comment: (NOTE)                                                                                               **Normal Reference Range: Negative**          Assay performed using the Gen-Probe APTIMA COMBO2 (R) Assay.     Acceptable specimen types for this assay include APTIMA Swabs (Unisex,     endocervical, urethral, or vaginal), first void urine, and ThinPrep  liquid based cytology samples.     Performed at [facility]  WET PREP, GENITAL     Status: Abnormal   Collection Time    09/28/13  1:15 AM      Result Value Range   Yeast Wet Prep HPF POC NONE SEEN  NONE SEEN   Trich, Wet Prep NONE SEEN  NONE SEEN   Clue Cells Wet Prep HPF POC NONE SEEN  NONE SEEN   WBC, Wet Prep HPF POC FEW (*) NONE SEEN   Comment: MODERATE BACTERIA SEEN  AMNISURE RUPTURE OF MEMBRANE (ROM)     Status: None   Collection Time    09/28/13  1:55 AM      Result Value Range   Amnisure ROM POSITIVE    POCT FERN TEST     Status: None   Collection Time    09/28/13  2:01 AM      Result Value Range   POCT Fern Test Positive = ruptured amniotic membanes    POSTPARTUM HEMORRHAGE PROTOCOL (BB NOTIFICATION)     Status: None   Collection Time    09/28/13  5:25 PM      Result Value Range   Initiate PPH protocol (BB notified) PPH ORDER RECEIVED    TYPE AND SCREEN     Status: None   Collection Time    09/28/13  5:30 PM      Result Value Range   ABO/RH(D) O POS     Antibody Screen NEG     Sample Expiration 10/01/2013     Unit Number [id number]     Blood  Component Type RED CELLS,LR     Unit division 00     Status of Unit ISSUED,FINAL     Transfusion Status OK TO TRANSFUSE     Crossmatch Result Compatible     Unit Number [id number]     Blood Component Type RBC LR PHER2     Unit division 00     Status of Unit ISSUED,FINAL     Transfusion Status OK TO TRANSFUSE     Crossmatch Result Compatible    DIC (DISSEMINATED INTRAVASCULAR COAGULATION) PANEL     Status: Abnormal   Collection Time    09/28/13  5:30 PM      Result Value Range   Prothrombin Time 13.7  11.6 - 15.2 seconds   INR 1.07  0.00 - 1.49   aPTT 27  24 - 37 seconds   Fibrinogen 472  204 - 475 mg/dL   D-Dimer, Quant 2.72 (*) 0.00 - 0.48 ug/mL-FEU   Comment:            AT THE INHOUSE ESTABLISHED CUTOFF     VALUE OF 0.48 ug/mL FEU,     THIS ASSAY HAS BEEN DOCUMENTED     IN THE LITERATURE TO HAVE     A SENSITIVITY AND NEGATIVE     PREDICTIVE VALUE OF AT LEAST     98 TO 99%.  THE TEST RESULT     SHOULD BE CORRELATED WITH     AN ASSESSMENT OF THE CLINICAL     PROBABILITY OF DVT / VTE.   Platelets 231  150 - 400 K/uL   Smear Review NO SCHISTOCYTES SEEN    CBC     Status: Abnormal   Collection Time    09/28/13  5:30 PM      Result Value Range   WBC 19.8 (*) 4.0 - 10.5 K/uL   RBC 3.31 (*) 3.87 -  5.11 MIL/uL   Hemoglobin 10.1 (*) 12.0 - 15.0 g/dL   HCT 16.1 (*) 09.6 - 04.5 %   MCV 88.8  78.0 - 100.0 fL   MCH 30.5  26.0 - 34.0 pg   MCHC 34.4  30.0 - 36.0 g/dL   RDW 40.9  81.1 - 91.4 %   Platelets 226  150 - 400 K/uL  CBC     Status: Abnormal   Collection Time    09/28/13 11:14 PM      Result Value Range   WBC 17.5 (*) 4.0 - 10.5 K/uL   RBC 3.32 (*) 3.87 - 5.11 MIL/uL   Hemoglobin 10.1 (*) 12.0 - 15.0 g/dL   HCT 78.2 (*) 95.6 - 21.3 %   MCV 88.9  78.0 - 100.0 fL   MCH 30.4  26.0 - 34.0 pg   MCHC 34.2  30.0 - 36.0 g/dL   RDW 08.6  57.8 - 46.9 %   Platelets 144 (*) 150 - 400 K/uL   Comment: DELTA CHECK NOTED     SPECIMEN CHECKED FOR CLOTS     REPEATED TO VERIFY   MRSA PCR SCREENING     Status: None   Collection Time    09/28/13 11:50 PM      Result Value Range   MRSA by PCR NEGATIVE  NEGATIVE   Comment:            The GeneXpert MRSA Assay (FDA     approved for NASAL specimens     only), is one component of a     comprehensive MRSA colonization     surveillance program. It is not     intended to diagnose MRSA     infection nor to guide or     monitor treatment for     MRSA infections.  CBC     Status: Abnormal   Collection Time    09/29/13  5:17 AM      Result Value Range   WBC 14.2 (*) 4.0 - 10.5 K/uL   RBC 2.96 (*) 3.87 - 5.11 MIL/uL   Hemoglobin 9.0 (*) 12.0 - 15.0 g/dL   HCT 62.9 (*) 52.8 - 41.3 %   MCV 87.5  78.0 - 100.0 fL   MCH 30.4  26.0 - 34.0 pg   MCHC 34.7  30.0 - 36.0 g/dL   RDW 24.4  01.0 - 27.2 %   Platelets 146 (*) 150 - 400 K/uL   Treatments: Transfusion with 2 units of pRBCs  Discharge Exam: Blood pressure 103/71, pulse 83, temperature 98.8 F (37.1 C), temperature source Oral, resp. rate 15, height 5\' 4"  (1.626 m), weight 180 lb (81.647 kg), last menstrual period 05/07/2013, SpO2 95.00%, unknown if currently breastfeeding. General appearance: alert and no distress Resp: clear to auscultation bilaterally Cardio: regular rate and rhythm GI: soft, non-tender; bowel sounds normal; no masses,  no organomegaly Pelvic: Scant blood on pad Extremities: extremities normal, atraumatic, no cyanosis or edema and Homans sign is negative, no sign of DVT  Disposition: 01-Home or Self Care       Future Appointments Provider Department Dept Phone   10/20/2013 1:15 PM Willodean Rosenthal, MD [facility] [phone]       Medication List    STOP taking these medications       metroNIDAZOLE 500 MG tablet  Commonly known as:  FLAGYL     nitrofurantoin (macrocrystal-monohydrate) 100 MG capsule  Commonly known as:  MACROBID     ondansetron 4  MG tablet  Commonly known as:  ZOFRAN      TAKE these  medications       aspirin-acetaminophen-caffeine 250-250-65 MG per tablet  Commonly known as:  EXCEDRIN MIGRAINE  Take 1 tablet by mouth every 6 (six) hours as needed for pain.     ibuprofen 600 MG tablet  Commonly known as:  ADVIL,MOTRIN  Take 1 tablet (600 mg total) by mouth every 6 (six) hours as needed (mild pain).     oxyCODONE-acetaminophen 5-325 MG per tablet  Commonly known as:  PERCOCET/ROXICET  Take 1-2 tablets by mouth every 4 (four) hours as needed for severe pain (moderate to severe pain (when tolerating fluids)).     PRENATAL COMPLETE 14-0.4 MG Tabs  Take 1 tablet by mouth daily.       Follow-up Information   Follow up with Willodean Rosenthal, MD On 10/20/2013. (1:15pm for postoperative check. Come to MAU for any concerns)    Specialty:  Obstetrics and Gynecology   Contact information:   [address] [place] [address] [phone]       Signed: Tereso Newcomer 09/29/2013, 10:01 AM

## 2013-10-01 NOTE — Progress Notes (Signed)
Post discharge chart review completed.  

## 2013-10-03 ENCOUNTER — Encounter (HOSPITAL_COMMUNITY): Payer: Self-pay | Admitting: Family

## 2013-10-03 ENCOUNTER — Inpatient Hospital Stay (HOSPITAL_COMMUNITY)
Admission: AD | Admit: 2013-10-03 | Discharge: 2013-10-03 | Disposition: A | Discharge status: Home / Self Care | Payer: Medicaid Other | Source: Ambulatory Visit | Attending: Obstetrics and Gynecology | Admitting: Obstetrics and Gynecology

## 2013-10-03 DIAGNOSIS — R51 Headache: Secondary | ICD-10-CM | POA: Insufficient documentation

## 2013-10-03 DIAGNOSIS — Z87891 Personal history of nicotine dependence: Secondary | ICD-10-CM | POA: Insufficient documentation

## 2013-10-03 DIAGNOSIS — R52 Pain, unspecified: Secondary | ICD-10-CM | POA: Insufficient documentation

## 2013-10-03 DIAGNOSIS — D649 Anemia, unspecified: Secondary | ICD-10-CM

## 2013-10-03 DIAGNOSIS — O9081 Anemia of the puerperium: Secondary | ICD-10-CM

## 2013-10-03 LAB — CBC
HCT: 24.8 % — ABNORMAL LOW (ref 36.0–46.0)
MCH: 30.3 pg (ref 26.0–34.0)
MCV: 89.5 fL (ref 78.0–100.0)
Platelets: 212 10*3/uL (ref 150–400)
RBC: 2.77 MIL/uL — ABNORMAL LOW (ref 3.87–5.11)
RDW: 13.8 % (ref 11.5–15.5)
WBC: 6.2 10*3/uL (ref 4.0–10.5)

## 2013-10-03 LAB — URINALYSIS, ROUTINE W REFLEX MICROSCOPIC
Glucose, UA: NEGATIVE mg/dL
Ketones, ur: NEGATIVE mg/dL
Protein, ur: NEGATIVE mg/dL
Urobilinogen, UA: 0.2 mg/dL (ref 0.0–1.0)

## 2013-10-03 LAB — COMPREHENSIVE METABOLIC PANEL
AST: 10 U/L (ref 0–37)
Albumin: 2.7 g/dL — ABNORMAL LOW (ref 3.5–5.2)
CO2: 26 mEq/L (ref 19–32)
Calcium: 8.2 mg/dL — ABNORMAL LOW (ref 8.4–10.5)
Creatinine, Ser: 0.64 mg/dL (ref 0.50–1.10)
GFR calc Af Amer: >90 mL/min (ref >90)
GFR calc non Af Amer: >90 mL/min (ref >90)

## 2013-10-03 LAB — URINE MICROSCOPIC-ADD ON

## 2013-10-03 MED ORDER — DIPHENHYDRAMINE HCL 50 MG/ML IJ SOLN
12.5000 mg | Freq: Once | INTRAMUSCULAR | Status: AC
  Administered 2013-10-03: 12.5 mg via INTRAVENOUS
  Filled 2013-10-03: qty 1

## 2013-10-03 MED ORDER — METOCLOPRAMIDE HCL 5 MG/ML IJ SOLN
10.0000 mg | Freq: Once | INTRAMUSCULAR | Status: AC
  Administered 2013-10-03: 10 mg via INTRAVENOUS
  Filled 2013-10-03: qty 2

## 2013-10-03 MED ORDER — POTASSIUM CHLORIDE 10 MEQ/100ML IV SOLN
10.0000 meq | Freq: Once | INTRAVENOUS | Status: AC
  Administered 2013-10-03: 10 meq via INTRAVENOUS
  Filled 2013-10-03: qty 100

## 2013-10-03 MED ORDER — LACTATED RINGERS IV SOLN
INTRAVENOUS | Status: DC
  Administered 2013-10-03: 15:00:00 via INTRAVENOUS

## 2013-10-03 MED ORDER — DEXAMETHASONE SODIUM PHOSPHATE 10 MG/ML IJ SOLN
10.0000 mg | Freq: Once | INTRAMUSCULAR | Status: AC
  Administered 2013-10-03: 10 mg via INTRAVENOUS
  Filled 2013-10-03: qty 1

## 2013-10-03 MED ORDER — BUTALBITAL-APAP-CAFFEINE 50-325-40 MG PO TABS: 1.0000 | ORAL_TABLET | Freq: Four times a day (QID) | ORAL | Status: DC | PRN

## 2013-10-03 NOTE — MAU Note (Signed)
32 yo, s/p IUFD on 12/23, reports she was discharged on 12/24 with HA; HA still lingers, unrelieved by ibuprofen or Percocet. Reports dizziness and feeling faint. Reports light vaginal bleeding.

## 2013-10-03 NOTE — MAU Note (Signed)
Pt states was here last week and delivered non-viable fetus, then was taken to OR for hemorrhage and retained placenta. Received 2 units of blood. Since being discharged has had leg pain, migraine, and body aches, and nausea. Denies diarrhea.

## 2013-10-03 NOTE — MAU Provider Note (Signed)
History     CSN: [id number]  Arrival date and time: 10/03/13 1245   First Provider Initiated Contact with Patient 10/03/13 1417      Chief Complaint  Patient presents with  . Migraine  . Generalized Body Aches   HPI  [name] is a 32 y.o. female [phone] who presents with complaints of ongoing HA. She delivered a non viable 16 week fetus on December 23. She received 2 units of blood for a placental hemorrage and was taken to the OR. She was sent home on December 24th in stable condition. The HA started prior to her discharge home and has persisted despite taking ibuprofen, percocet, Excedrin and tylenol. She has general body aches, she feels like her body is shutting down.  She currently rates her HA 7/10. She did not have an epidural or spinal for delivery. She feels she is dealing with the loss of her baby appropriately, however last night was the first night she was by herself without friends and family.   OB History   Grav Para Term Preterm Abortions TAB SAB Ect Mult Living   9 7 4 2 2  2   7       Past Medical History  Diagnosis Date  . Chlamydia   . Abnormal Pap smear   . Kidney stones   . Headache(784.0)     migraines  . Gonorrhea in pregnancy     Past Surgical History  Procedure Laterality Date  . Colposcopy    . Leep    . Dilation and evacuation  05/07/2011    Procedure: DILATATION AND EVACUATION (D&E);  Surgeon: Scheryl Darter, MD;  Location: [facility];  Service: Gynecology;  Laterality: N/A;  . Dilation and curettage of uterus N/A 09/28/2013    Procedure: DILATATION AND EVACUATION FOR RETAINED PLACENTA;  Surgeon: Tereso Newcomer, MD;  Location: [facility];  Service: Gynecology;  Laterality: N/A;    Family History  Problem Relation Age of Onset  . Cancer Maternal Grandmother   . Depression Maternal Grandmother   . Cancer Maternal Grandfather   . Depression Maternal Grandfather   . Cancer Paternal Grandmother   . Depression Paternal Grandmother   . Cancer  Paternal Grandfather   . Depression Paternal Grandfather     History  Substance Use Topics  . Smoking status: Former Smoker -- 0.25 packs/day    Types: Cigarettes    Quit date: 03/26/2012  . Smokeless tobacco: Not on file  . Alcohol Use: No    Allergies: No Known Allergies  Prescriptions prior to admission  Medication Sig Dispense Refill  . ibuprofen (ADVIL,MOTRIN) 600 MG tablet Take 1 tablet (600 mg total) by mouth every 6 (six) hours as needed (mild pain).  30 tablet  2  . oxyCODONE-acetaminophen (PERCOCET/ROXICET) 5-325 MG per tablet Take 1-2 tablets by mouth every 4 (four) hours as needed for severe pain (moderate to severe pain (when tolerating fluids)).  30 tablet  0  . Prenatal Vit-Fe Fumarate-FA (PRENATAL MULTIVITAMIN) TABS tablet Take 1 tablet by mouth daily at 12 noon.       Results for orders placed during the hospital encounter of 10/03/13 (from the past 24 hour(s))  URINALYSIS, ROUTINE W REFLEX MICROSCOPIC     Status: Abnormal   Collection Time    10/03/13  1:11 PM      Result Value Range   Color, Urine YELLOW  YELLOW   APPearance CLEAR  CLEAR   Specific Gravity, Urine 1.010  1.005 - 1.030  pH 7.0  5.0 - 8.0   Glucose, UA NEGATIVE  NEGATIVE mg/dL   Hgb urine dipstick MODERATE (*) NEGATIVE   Bilirubin Urine NEGATIVE  NEGATIVE   Ketones, ur NEGATIVE  NEGATIVE mg/dL   Protein, ur NEGATIVE  NEGATIVE mg/dL   Urobilinogen, UA 0.2  0.0 - 1.0 mg/dL   Nitrite NEGATIVE  NEGATIVE   Leukocytes, UA SMALL (*) NEGATIVE  URINE MICROSCOPIC-ADD ON     Status: Abnormal   Collection Time    10/03/13  1:11 PM      Result Value Range   Squamous Epithelial / LPF MANY (*) RARE   WBC, UA 7-10  <3 WBC/hpf   RBC / HPF 3-6  <3 RBC/hpf   Bacteria, UA FEW (*) RARE  CBC     Status: Abnormal   Collection Time    10/03/13  2:09 PM      Result Value Range   WBC 6.2  4.0 - 10.5 K/uL   RBC 2.77 (*) 3.87 - 5.11 MIL/uL   Hemoglobin 8.4 (*) 12.0 - 15.0 g/dL   HCT 16.1 (*) 09.6 - 04.5 %    MCV 89.5  78.0 - 100.0 fL   MCH 30.3  26.0 - 34.0 pg   MCHC 33.9  30.0 - 36.0 g/dL   RDW 40.9  81.1 - 91.4 %   Platelets 212  150 - 400 K/uL  COMPREHENSIVE METABOLIC PANEL     Status: Abnormal   Collection Time    10/03/13  2:09 PM      Result Value Range   Sodium 139  135 - 145 mEq/L   Potassium 3.1 (*) 3.5 - 5.1 mEq/L   Chloride 105  96 - 112 mEq/L   CO2 26  19 - 32 mEq/L   Glucose, Bld 95  70 - 99 mg/dL   BUN 5 (*) 6 - 23 mg/dL   Creatinine, Ser 7.82  0.50 - 1.10 mg/dL   Calcium 8.2 (*) 8.4 - 10.5 mg/dL   Total Protein 5.3 (*) 6.0 - 8.3 g/dL   Albumin 2.7 (*) 3.5 - 5.2 g/dL   AST 10  0 - 37 U/L   ALT 8  0 - 35 U/L   Alkaline Phosphatase 71  39 - 117 U/L   Total Bilirubin 0.1 (*) 0.3 - 1.2 mg/dL   GFR calc non Af Amer >90  >90 mL/min   GFR calc Af Amer >90  >90 mL/min    Review of Systems  Constitutional: Positive for malaise/fatigue. Negative for fever and chills.  Gastrointestinal: Positive for nausea. Negative for vomiting, abdominal pain, diarrhea and constipation.  Genitourinary: Negative for dysuria, urgency, frequency and hematuria.       No vaginal discharge. + vaginal bleeding; light, not soaking through a pad.  No dysuria.   Neurological: Positive for weakness and headaches.   Physical Exam   Blood pressure 127/83, pulse 90, temperature 98.3 F (36.8 C), temperature source Oral, resp. rate 20, height 5\' 4"  (1.626 m), weight 81.647 kg (180 lb), last menstrual period 05/07/2013, not currently breastfeeding.  Physical Exam  Constitutional: She is oriented to person, place, and time. She appears well-developed and well-nourished. No distress.  HENT:  Head: Normocephalic.  Eyes: Pupils are equal, round, and reactive to light.  Neck: Normal range of motion. Neck supple.  Neurological: She is alert and oriented to person, place, and time. GCS eye subscore is 4. GCS verbal subscore is 5. GCS motor subscore is 6.  Skin: Skin is  warm. She is not diaphoretic.   Psychiatric: Her behavior is normal.    MAU Course  Procedures None  MDM CBC CMET LR  Decadron Reglan  Benadryl Potassium times 1 run 1600: patient rates her headache pain 0/10 Consulted with Dr. Jolayne Panther   Assessment and Plan   A: Anemia  Headache   P: Discharge home RX: Fioricet Start Iron over the counter 325 mg daily  Return if bleeding increases or you develop abdominal pain  Iona Hansen Rasch, NP  10/03/2013, 2:17 PM

## 2013-10-04 LAB — URINE CULTURE

## 2013-10-04 NOTE — MAU Provider Note (Signed)
Attestation of Attending Supervision of Advanced Practitioner (CNM/NP): Evaluation and management procedures were performed by the Advanced Practitioner under my supervision and collaboration.  I have reviewed the Advanced Practitioner's note and chart, and I agree with the management and plan.  Heyli Min 10/04/2013 10:44 AM   

## 2013-10-20 ENCOUNTER — Encounter: Payer: Self-pay | Admitting: Obstetrics & Gynecology

## 2013-10-20 ENCOUNTER — Ambulatory Visit (INDEPENDENT_AMBULATORY_CARE_PROVIDER_SITE_OTHER): Payer: Medicaid Other | Admitting: Obstetrics & Gynecology

## 2013-10-20 VITALS — BP 144/89 | HR 96 | Temp 98.3°F | Wt 167.5 lb

## 2013-10-20 DIAGNOSIS — Z3009 Encounter for other general counseling and advice on contraception: Secondary | ICD-10-CM

## 2013-10-20 DIAGNOSIS — O039 Complete or unspecified spontaneous abortion without complication: Secondary | ICD-10-CM

## 2013-10-20 DIAGNOSIS — Z09 Encounter for follow-up examination after completed treatment for conditions other than malignant neoplasm: Secondary | ICD-10-CM

## 2013-10-20 LAB — CBC
HEMATOCRIT: 34.5 % — AB (ref 36.0–46.0)
Hemoglobin: 11.3 g/dL — ABNORMAL LOW (ref 12.0–15.0)
MCH: 29.5 pg (ref 26.0–34.0)
MCHC: 32.8 g/dL (ref 30.0–36.0)
MCV: 90.1 fL (ref 78.0–100.0)
PLATELETS: 286 10*3/uL (ref 150–400)
RBC: 3.83 MIL/uL — ABNORMAL LOW (ref 3.87–5.11)
RDW: 14.2 % (ref 11.5–15.5)
WBC: 7.1 10*3/uL (ref 4.0–10.5)

## 2013-10-20 MED ORDER — NORGESTIMATE-ETH ESTRADIOL 0.25-35 MG-MCG PO TABS: 1.0000 | ORAL_TABLET | Freq: Every day | ORAL | Status: DC

## 2013-10-20 NOTE — Patient Instructions (Addendum)
Oral Contraception Use Oral contraceptive pills (OCPs) are medicines taken to prevent pregnancy. OCPs work by preventing the ovaries from releasing eggs. The hormones in OCPs also cause the cervical mucus to thicken, preventing the sperm from entering the uterus. The hormones also cause the uterine lining to become thin, not allowing a fertilized egg to attach to the inside of the uterus. OCPs are highly effective when taken exactly as prescribed. However, OCPs do not prevent sexually transmitted diseases (STDs). Safe sex practices, such as using condoms along with an OCP, can help prevent STDs. Before taking OCPs, you may have a physical exam and Pap test. Your health care provider may also order blood tests if necessary. Your health care provider will make sure you are a good candidate for oral contraception. Discuss with your health care provider the possible side effects of the OCP you may be prescribed. When starting an OCP, it can take 2 to 3 months for the body to adjust to the changes in hormone levels in your body.  HOW TO TAKE ORAL CONTRACEPTIVE PILLS Your health care provider may advise you on how to start taking the first cycle of OCPs. Otherwise, you can:   Start on day 1 of your menstrual period. You will not need any backup contraceptive protection with this start time.   Start on the first Sunday after your menstrual period or the day you get your prescription. In these cases, you will need to use backup contraceptive protection for the first week.   Start the pill at any time of your cycle. If you take the pill within 5 days of the start of your period, you are protected against pregnancy right away. In this case, you will not need a backup form of birth control. If you start at any other time of your menstrual cycle, you will need to use another form of birth control for 7 days. If your OCP is the type called a minipill, it will protect you from pregnancy after taking it for 2 days (48  hours). After you have started taking OCPs:   If you forget to take 1 pill, take it as soon as you remember. Take the next pill at the regular time.   If you miss 2 or more pills, call your health care provider because different pills have different instructions for missed doses. Use backup birth control until your next menstrual period starts.   If you use a 28-day pack that contains inactive pills and you miss 1 of the last 7 pills (pills with no hormones), it will not matter. Throw away the rest of the nonhormone pills and start a new pill pack.  No matter which day you start the OCP, you will always start a new pack on that same day of the week. Have an extra pack of OCPs and a backup contraceptive method available in case you miss some pills or lose your OCP pack.  HOME CARE INSTRUCTIONS   Do not smoke.   Always use a condom to protect against STDs. OCPs do not protect against STDs.   Use a calendar to mark your menstrual period days.   Read the information and directions that came with your OCP. Talk to your health care provider if you have questions.  SEEK MEDICAL CARE IF:   You develop nausea and vomiting.   You have abnormal vaginal discharge or bleeding.   You develop a rash.   You miss your menstrual period.   You are losing   your hair.   You need treatment for mood swings or depression.   You get dizzy when taking the OCP.   You develop acne from taking the OCP.   You become pregnant.  SEEK IMMEDIATE MEDICAL CARE IF:   You develop chest pain.   You develop shortness of breath.   You have an uncontrolled or severe headache.   You develop numbness or slurred speech.   You develop visual problems.   You develop pain, redness, and swelling in the legs.  Document Released: 09/12/2011 Document Revised: 05/26/2013 Document Reviewed: 03/14/2013 [facility] Patient Information 2014 [place], [place]. Sterilization Information, Female Female  sterilization is a procedure to permanently prevent pregnancy. There are different ways to perform sterilization, but all either block or close the fallopian tubes so that your eggs cannot reach your uterus. If your egg cannot reach your uterus, sperm cannot fertilize the egg, and you cannot get pregnant.  Sterilization is performed by a surgical procedure. Sometimes these procedures are performed in a hospital while a patient is asleep. Sometimes they can be done in a clinic setting with the patient awake. The fallopian tubes can be surgically cut, tied, or sealed through a procedure called tubal ligation. The fallopian tubes can also be closed with clips or rings. Sterilization can also be done by placing a tiny coil into each fallopian tube, which causes scar tissue to grow inside the tube. The scar tissue then blocks the tubes.  Discuss sterilization with your caregiver to answer any concerns you or your partner may have. You may want to ask what type of sterilization your caregiver performs. Some caregivers may not perform all the various options. Sterilization is permanent and should only be done if you are sure you do not want children or do not want any more children. Having a sterilization reversed may not be successful.  STERILIZATION PROCEDURES  Laparoscopic sterilization. This is a surgical method performed at a time other than right after childbirth. Two incisions are made in the lower abdomen. A thin, lighted tube (laparoscope) is inserted into one of the incisions and is used to perform the procedure. The fallopian tubes are closed with a ring or a clip. An instrument that uses heat could be used to seal the tubes closed (electrocautery).   Mini-laparotomy. This is a surgical method done 1 or 2 days after giving birth. Typically, a small incision is made just below the belly button (umbilicus) and the fallopian tubes are exposed. The tubes can then be sealed, tied, or cut.   Hysteroscopic  sterilization. This is performed at a time other than right after childbirth. A tiny, spring-like coil is inserted through the cervix and uterus and placed into the fallopian tubes. The coil causes scaring and blocks the tubes. Other forms of contraception should be used for 3 months after the procedure to allow the scar tissue to form completely. Additionally, it is required hysterosalpingography be done 3 months later to ensure that the procedure was successful. Hysterosalpingography is a procedure that uses X-rays to look at your uterus and fallopian tubes after a material to make them show up better has been inserted. IS STERILIZATION SAFE? Sterilization is considered safe with very rare complications. Risks depend on the type of procedure you have. As with any surgical procedure, there are risks. Some risks of sterilization by any means include:   Bleeding.  Infection.  Reaction to anesthesia medicine.  Injury to surrounding organs. Risks specific to having hysteroscopic coils placed include:  The  coils may not be placed correctly the first time.   The coils may move out of place.   The tubes may not get completely blocked after 3 months.   Injury to surrounding organs when placing the coil.  HOW EFFECTIVE IS FEMALE STERILIZATION? Sterilization is nearly 100% effective, but it can fail. Depending on the type of sterilization, the rate of failure can be as high as 3%. After hysteroscopic sterilization with placement of fallopian tube coils, you will need back-up birth control for 3 months after the procedure. Sterilization is effective for a lifetime.  BENEFITS OF STERILIZATION  It does not affect your hormones, and therefore will not affect your menstrual periods, sexual desire, or performance.   It is effective for a lifetime.   It is safe.   You do not need to worry about getting pregnant. Keep in mind that if you had the hysteroscopic placement procedure, you must wait  3 months after the procedure (or until your caregiver confirms) before pregnancy is not considered possible.   There are no side effects unlike other types of birth control (contraception).  DRAWBACKS OF STERILIZATION  You must be sure you do not want children or any more children. The procedure is permanent.   It does not provide protection against sexually transmitted infections (STIs).   The tubes can grow back together. If this happens, there is a risk of pregnancy. There is also an increased risk (50%) of pregnancy being an ectopic pregnancy. This is a pregnancy that happens outside of the uterus. Document Released: 03/11/2008 Document Revised: 03/24/2012 Document Reviewed: 01/09/2012 [facility] Patient Information 2014 [place], [place].

## 2013-10-20 NOTE — Progress Notes (Signed)
Subjective:     Patient ID: [name] MeyerMarsha [name], female   DOB: 20-Aug-1981, 33 y.o.   MRN: [id number]  HPI Pt presents for f/u after a D&C for retained placenta.  She reports that her bleeding stopped yesterday. She denies being dizzy or SOB.  She reports that she stopped smoking at the onset of the pregnancy and has not restarted.  Review of Systems     Objective:   Physical Exam BP 144/89  Pulse 96  Temp(Src) 98.3 F (36.8 C)  Wt 167 lb 8 oz (75.978 kg) Pt in NAD Abd: nontender, nondistended GYN exam deferred   09/28/2013 Diagnosis Products of Conception, retained placental fragments - TRIVASCULAR PLACENTA, CLINICALLY RETAINED PLACENTAL FRAGMENTS. - THREE VESSEL UMBILICAL CORD DEMONSTRATES NO EVIDENCE OF CHORIOAMNIONITIS. - DISRUPTED FETAL MEMBRANES WITH UNDERLYING FETAL CHORIONIC VILLI SHOWING NO SIGNIFICANT PATHOLOGIC ABNORMALITY.     Assessment:     Post op check Contraception counseling requested OCP's   Upon leaving pt requested BTL but, was unable to stay for discussion pt will sign BTL forms and f/u for discussion.     Plan:     CBC today sprintec 1 po q day Title XIX papers signed today F/u for BTL consult.

## 2013-10-21 ENCOUNTER — Encounter: Payer: Self-pay | Admitting: *Deleted

## 2014-04-20 ENCOUNTER — Encounter (HOSPITAL_COMMUNITY): Payer: Self-pay

## 2014-04-20 ENCOUNTER — Inpatient Hospital Stay (HOSPITAL_COMMUNITY)
Admission: AD | Admit: 2014-04-20 | Discharge: 2014-04-20 | Disposition: A | Discharge status: Home / Self Care | Payer: Medicaid Other | Source: Ambulatory Visit | Attending: Obstetrics & Gynecology | Admitting: Obstetrics & Gynecology

## 2014-04-20 DIAGNOSIS — O210 Mild hyperemesis gravidarum: Secondary | ICD-10-CM | POA: Insufficient documentation

## 2014-04-20 DIAGNOSIS — Z87891 Personal history of nicotine dependence: Secondary | ICD-10-CM | POA: Diagnosis not present

## 2014-04-20 DIAGNOSIS — O219 Vomiting of pregnancy, unspecified: Secondary | ICD-10-CM

## 2014-04-20 LAB — POCT PREGNANCY, URINE: Preg Test, Ur: POSITIVE — AB

## 2014-04-20 LAB — COMPREHENSIVE METABOLIC PANEL
ALT: 14 U/L (ref 0–35)
ANION GAP: 13 (ref 5–15)
AST: 19 U/L (ref 0–37)
Albumin: 3.8 g/dL (ref 3.5–5.2)
Alkaline Phosphatase: 102 U/L (ref 39–117)
BUN: 10 mg/dL (ref 6–23)
CALCIUM: 9.4 mg/dL (ref 8.4–10.5)
CO2: 23 meq/L (ref 19–32)
Chloride: 102 mEq/L (ref 96–112)
Creatinine, Ser: 0.55 mg/dL (ref 0.50–1.10)
GFR calc Af Amer: >90 mL/min (ref >90)
Glucose, Bld: 82 mg/dL (ref 70–99)
Potassium: 4.5 mEq/L (ref 3.7–5.3)
Sodium: 138 mEq/L (ref 137–147)
TOTAL PROTEIN: 6.9 g/dL (ref 6.0–8.3)
Total Bilirubin: <0.2 mg/dL — ABNORMAL LOW (ref 0.3–1.2)

## 2014-04-20 LAB — URINALYSIS, ROUTINE W REFLEX MICROSCOPIC
Bilirubin Urine: NEGATIVE
Glucose, UA: NEGATIVE mg/dL
Ketones, ur: 15 mg/dL — AB
Leukocytes, UA: NEGATIVE
NITRITE: NEGATIVE
PROTEIN: NEGATIVE mg/dL
UROBILINOGEN UA: 0.2 mg/dL (ref 0.0–1.0)
pH: 5.5 (ref 5.0–8.0)

## 2014-04-20 LAB — CBC WITH DIFFERENTIAL/PLATELET
Basophils Absolute: 0 10*3/uL (ref 0.0–0.1)
Basophils Relative: 0 % (ref 0–1)
EOS ABS: 0.2 10*3/uL (ref 0.0–0.7)
EOS PCT: 3 % (ref 0–5)
HCT: 39.9 % (ref 36.0–46.0)
HEMOGLOBIN: 13.4 g/dL (ref 12.0–15.0)
LYMPHS ABS: 2.1 10*3/uL (ref 0.7–4.0)
LYMPHS PCT: 28 % (ref 12–46)
MCH: 30 pg (ref 26.0–34.0)
MCHC: 33.6 g/dL (ref 30.0–36.0)
MCV: 89.5 fL (ref 78.0–100.0)
Monocytes Absolute: 0.6 10*3/uL (ref 0.1–1.0)
Monocytes Relative: 8 % (ref 3–12)
Neutro Abs: 4.6 10*3/uL (ref 1.7–7.7)
Neutrophils Relative %: 61 % (ref 43–77)
Platelets: 182 10*3/uL (ref 150–400)
RBC: 4.46 MIL/uL (ref 3.87–5.11)
RDW: 14 % (ref 11.5–15.5)
WBC: 7.6 10*3/uL (ref 4.0–10.5)

## 2014-04-20 LAB — URINE MICROSCOPIC-ADD ON

## 2014-04-20 MED ORDER — PROMETHAZINE HCL 12.5 MG PO TABS: 12.5000 mg | ORAL_TABLET | Freq: Four times a day (QID) | ORAL | Status: DC | PRN

## 2014-04-20 MED ORDER — PROMETHAZINE HCL 25 MG/ML IJ SOLN
25.0000 mg | Freq: Once | INTRAVENOUS | Status: AC
  Administered 2014-04-20: 25 mg via INTRAVENOUS
  Filled 2014-04-20: qty 1

## 2014-04-20 NOTE — MAU Note (Signed)
[facility] [place] [facility] states she is vomiting every day.

## 2014-04-20 NOTE — Discharge Instructions (Signed)
Morning Sickness Morning sickness is when you feel sick to your stomach (nauseous) during pregnancy. This nauseous feeling may or may not come with vomiting. It often occurs in the morning but can be a problem any time of day. Morning sickness is most common during the first trimester, but it may continue throughout pregnancy. While morning sickness is unpleasant, it is usually harmless unless you develop severe and continual vomiting (hyperemesis gravidarum). This condition requires more intense treatment.  CAUSES  The cause of morning sickness is not completely known but seems to be related to normal hormonal changes that occur in pregnancy. RISK FACTORS You are at greater risk if you:  Experienced nausea or vomiting before your pregnancy.  Had morning sickness during a previous pregnancy.  Are pregnant with more than one baby, such as twins. TREATMENT  Do not use any medicines (prescription, over-the-counter, or herbal) for morning sickness without first talking to your health care provider. Your health care provider may prescribe or recommend:  Vitamin B6 supplements.  Anti-nausea medicines.  The herbal medicine ginger. HOME CARE INSTRUCTIONS   Only take over-the-counter or prescription medicines as directed by your health care provider.  Taking multivitamins before getting pregnant can prevent or decrease the severity of morning sickness in most women.  Eat a piece of dry toast or unsalted crackers before getting out of bed in the morning.  Eat five or six small meals a day.  Eat dry and bland foods (rice, baked potato). Foods high in carbohydrates are often helpful.  Do not drink liquids with your meals. Drink liquids between meals.  Avoid greasy, fatty, and spicy foods.  Get someone to cook for you if the smell of any food causes nausea and vomiting.  If you feel nauseous after taking prenatal vitamins, take the vitamins at night or with a snack.  Snack on protein  foods (nuts, yogurt, cheese) between meals if you are hungry.  Eat unsweetened gelatins for desserts.  Wearing an acupressure wristband (worn for sea sickness) may be helpful.  Acupuncture may be helpful.  Do not smoke.  Get a humidifier to keep the air in your house free of odors.  Get plenty of fresh air. SEEK MEDICAL CARE IF:   Your home remedies are not working, and you need medicine.  You feel dizzy or lightheaded.  You are losing weight. SEEK IMMEDIATE MEDICAL CARE IF:   You have persistent and uncontrolled nausea and vomiting.  You pass out (faint). MAKE SURE YOU:  Understand these instructions.  Will watch your condition.  Will get help right away if you are not doing well or get worse. Document Released: 11/14/2006 Document Revised: 09/28/2013 Document Reviewed: 03/10/2013 [facility] Patient Information 2015 [place], [place]. This information is not intended to replace advice given to you by your health care provider. Make sure you discuss any questions you have with your health care provider.  Hyperemesis Gravidarum Hyperemesis gravidarum is a severe form of nausea and vomiting that happens during pregnancy. Hyperemesis is worse than morning sickness. It may cause you to have nausea or vomiting all day for many days. It may keep you from eating and drinking enough food and liquids. Hyperemesis usually occurs during the first half (the first 20 weeks) of pregnancy. It often goes away once a woman is in her second half of pregnancy. However, sometimes hyperemesis continues through an entire pregnancy.  CAUSES  The cause of this condition is not completely known but is thought to be related to changes in  the body's hormones when pregnant. It could be from the high level of the pregnancy hormone or an increase in estrogen in the body.  SIGNS AND SYMPTOMS   Severe nausea and vomiting.  Nausea that does not go away.  Vomiting that does not allow you to keep any food  down.  Weight loss and body fluid loss (dehydration).  Having no desire to eat or not liking food you have previously enjoyed. DIAGNOSIS  Your health care provider will do a physical exam and ask you about your symptoms. He or she may also order blood tests and urine tests to make sure something else is not causing the problem.  TREATMENT  You may only need medicine to control the problem. If medicines do not control the nausea and vomiting, you will be treated in the hospital to prevent dehydration, increased acid in the blood (acidosis), weight loss, and changes in the electrolytes in your body that may harm the unborn baby (fetus). You may need IV fluids.  HOME CARE INSTRUCTIONS   Only take over-the-counter or prescription medicines as directed by your health care provider.  Try eating a couple of dry crackers or toast in the morning before getting out of bed.  Avoid foods and smells that upset your stomach.  Avoid fatty and spicy foods.  Eat 5-6 small meals a day.  Do not drink when eating meals. Drink between meals.  For snacks, eat high-protein foods, such as cheese.  Eat or suck on things that have ginger in them. Ginger helps nausea.  Avoid food preparation. The smell of food can spoil your appetite.  Avoid iron pills and iron in your multivitamins until after 3-4 months of being pregnant. However, consult with your health care provider before stopping any prescribed iron pills. SEEK MEDICAL CARE IF:   Your abdominal pain increases.  You have a severe headache.  You have vision problems.  You are losing weight. SEEK IMMEDIATE MEDICAL CARE IF:   You are unable to keep fluids down.  You vomit blood.  You have constant nausea and vomiting.  You have excessive weakness.  You have extreme thirst.  You have dizziness or fainting.  You have a fever or persistent symptoms for more than 2-3 days.  You have a fever and your symptoms suddenly get worse. MAKE SURE  YOU:   Understand these instructions.  Will watch your condition.  Will get help right away if you are not doing well or get worse. Document Released: 09/23/2005 Document Revised: 07/14/2013 Document Reviewed: 05/05/2013 [facility] Patient Information 2015 [place], [place]. This information is not intended to replace advice given to you by your health care provider. Make sure you discuss any questions you have with your health care provider.

## 2014-04-20 NOTE — MAU Provider Note (Signed)
History     CSN: [id number]  Arrival date and time: 04/20/14 1954   First Provider Initiated Contact with Patient 04/20/14 2039      Chief Complaint  Patient presents with  . Emesis  . Possible Pregnancy   HPI [name] is a 33 y.o. [id number] at [redacted]w[redacted]d who presents to MAU today with complaint of N/V x weeks. The patient denies diarrhea, constipation, abdominal pain, vaginal bleeding, discharge or UTI symptoms. LMP was 03/01/14. Patient has an appointment to start prenatal care with Dr. Gaynell Face on 04/25/14.   OB History   Grav Para Term Preterm Abortions TAB SAB Ect Mult Living   10 7 4 2 2  2   6       Past Medical History  Diagnosis Date  . Chlamydia   . Abnormal Pap smear   . Kidney stones   . Headache(784.0)     migraines  . Gonorrhea in pregnancy     Past Surgical History  Procedure Laterality Date  . Colposcopy    . Leep    . Dilation and evacuation  05/07/2011    Procedure: DILATATION AND EVACUATION (D&E);  Surgeon: Scheryl Darter, MD;  Location: [facility];  Service: Gynecology;  Laterality: N/A;  . Dilation and curettage of uterus N/A 09/28/2013    Procedure: DILATATION AND EVACUATION FOR RETAINED PLACENTA;  Surgeon: Tereso Newcomer, MD;  Location: [facility];  Service: Gynecology;  Laterality: N/A;    Family History  Problem Relation Age of Onset  . Cancer Maternal Grandmother   . Depression Maternal Grandmother   . Cancer Maternal Grandfather   . Depression Maternal Grandfather   . Cancer Paternal Grandmother   . Depression Paternal Grandmother   . Cancer Paternal Grandfather   . Depression Paternal Grandfather     History  Substance Use Topics  . Smoking status: Former Smoker -- 0.25 packs/day    Types: Cigarettes    Quit date: 03/26/2012  . Smokeless tobacco: Never Used  . Alcohol Use: No    Allergies: No Known Allergies  Prescriptions prior to admission  Medication Sig Dispense Refill  . emtricitabine (EMTRIVA) 200 MG capsule Take 200 mg by  mouth daily.      . raltegravir (ISENTRESS) 400 MG tablet Take 400 mg by mouth 2 (two) times daily.      Marland Kitchen tenofovir (VIREAD) 300 MG tablet Take 300 mg by mouth daily.        Review of Systems  Constitutional: Negative for fever and malaise/fatigue.  Gastrointestinal: Positive for nausea and vomiting. Negative for abdominal pain, diarrhea and constipation.  Genitourinary: Negative for dysuria, urgency and frequency.       Neg - vaginal bleeding, discharge   Physical Exam   Blood pressure 108/70, pulse 82, temperature 97.7 F (36.5 C), temperature source Oral, resp. rate 16, height 5\' 4"  (1.626 m), weight 172 lb (78.019 kg), last menstrual period 03/01/2014, SpO2 100.00%.  Physical Exam  Constitutional: She is oriented to person, place, and time. She appears well-developed and well-nourished. No distress.  HENT:  Head: Normocephalic and atraumatic.  Cardiovascular: Normal rate, regular rhythm and normal heart sounds.   Respiratory: Effort normal and breath sounds normal. No respiratory distress.  GI: Soft. She exhibits no distension and no mass. There is no tenderness. There is no rebound and no guarding.  Neurological: She is alert and oriented to person, place, and time.  Skin: Skin is warm and dry. No erythema.  Psychiatric: She has a normal mood  and affect.   Results for orders placed during the hospital encounter of 04/20/14 (from the past 24 hour(s))  URINALYSIS, ROUTINE W REFLEX MICROSCOPIC     Status: Abnormal   Collection Time    04/20/14  8:11 PM      Result Value Ref Range   Color, Urine YELLOW  YELLOW   APPearance HAZY (*) CLEAR   Specific Gravity, Urine >1.030 (*) 1.005 - 1.030   pH 5.5  5.0 - 8.0   Glucose, UA NEGATIVE  NEGATIVE mg/dL   Hgb urine dipstick MODERATE (*) NEGATIVE   Bilirubin Urine NEGATIVE  NEGATIVE   Ketones, ur 15 (*) NEGATIVE mg/dL   Protein, ur NEGATIVE  NEGATIVE mg/dL   Urobilinogen, UA 0.2  0.0 - 1.0 mg/dL   Nitrite NEGATIVE  NEGATIVE    Leukocytes, UA NEGATIVE  NEGATIVE  URINE MICROSCOPIC-ADD ON     Status: Abnormal   Collection Time    04/20/14  8:11 PM      Result Value Ref Range   Squamous Epithelial / LPF FEW (*) RARE   WBC, UA 0-2  <3 WBC/hpf   RBC / HPF 0-2  <3 RBC/hpf   Bacteria, UA FEW (*) RARE  POCT PREGNANCY, URINE     Status: Abnormal   Collection Time    04/20/14  8:17 PM      Result Value Ref Range   Preg Test, Ur POSITIVE (*) NEGATIVE  CBC WITH DIFFERENTIAL     Status: None   Collection Time    04/20/14 10:02 PM      Result Value Ref Range   WBC 7.6  4.0 - 10.5 K/uL   RBC 4.46  3.87 - 5.11 MIL/uL   Hemoglobin 13.4  12.0 - 15.0 g/dL   HCT 04.539.9  40.936.0 - 81.146.0 %   MCV 89.5  78.0 - 100.0 fL   MCH 30.0  26.0 - 34.0 pg   MCHC 33.6  30.0 - 36.0 g/dL   RDW 91.414.0  78.211.5 - 95.615.5 %   Platelets 182  150 - 400 K/uL   Neutrophils Relative % 61  43 - 77 %   Neutro Abs 4.6  1.7 - 7.7 K/uL   Lymphocytes Relative 28  12 - 46 %   Lymphs Abs 2.1  0.7 - 4.0 K/uL   Monocytes Relative 8  3 - 12 %   Monocytes Absolute 0.6  0.1 - 1.0 K/uL   Eosinophils Relative 3  0 - 5 %   Eosinophils Absolute 0.2  0.0 - 0.7 K/uL   Basophils Relative 0  0 - 1 %   Basophils Absolute 0.0  0.0 - 0.1 K/uL  COMPREHENSIVE METABOLIC PANEL     Status: Abnormal   Collection Time    04/20/14 10:02 PM      Result Value Ref Range   Sodium 138  137 - 147 mEq/L   Potassium 4.5  3.7 - 5.3 mEq/L   Chloride 102  96 - 112 mEq/L   CO2 23  19 - 32 mEq/L   Glucose, Bld 82  70 - 99 mg/dL   BUN 10  6 - 23 mg/dL   Creatinine, Ser 2.130.55  0.50 - 1.10 mg/dL   Calcium 9.4  8.4 - 08.610.5 mg/dL   Total Protein 6.9  6.0 - 8.3 g/dL   Albumin 3.8  3.5 - 5.2 g/dL   AST 19  0 - 37 U/L   ALT 14  0 - 35 U/L   Alkaline  Phosphatase 102  39 - 117 U/L   Total Bilirubin <0.2 (*) 0.3 - 1.2 mg/dL   GFR calc non Af Amer >90  >90 mL/min   GFR calc Af Amer >90  >90 mL/min   Anion gap 13  5 - 15    MAU Course  Procedures None  MDM +UPT UA, CBC and CMP today 1 liter  IV D5LR with 25 mg Phenergan infusion given in MAU No emesis in MAU Assessment and Plan  A: [facility] at [redacted]w[redacted]d Nausea and vomiting in pregnancy prior to [redacted] weeks gestation  P: Discharge home Patient given instructions for Unisom and B6 for N/V Rx for Phenergan given Discussed diet for N/V or pregnancy Patient advised to keep appointment to start prenatal care with Dr. Gaynell Face as scheduled Patient may return to MAU as needed or if her condition were to change or worsen  Freddi Starr, PA-C  04/20/2014, 11:26 PM

## 2014-04-27 LAB — CYTOLOGY - PAP: Pap: NEGATIVE

## 2014-05-06 ENCOUNTER — Inpatient Hospital Stay (HOSPITAL_COMMUNITY): Payer: Medicaid Other

## 2014-05-06 ENCOUNTER — Inpatient Hospital Stay (HOSPITAL_COMMUNITY)
Admission: AD | Admit: 2014-05-06 | Discharge: 2014-05-06 | Disposition: A | Discharge status: Home / Self Care | Payer: Medicaid Other | Source: Ambulatory Visit | Attending: Obstetrics | Admitting: Obstetrics

## 2014-05-06 ENCOUNTER — Encounter (HOSPITAL_COMMUNITY): Payer: Self-pay | Admitting: *Deleted

## 2014-05-06 DIAGNOSIS — Z87891 Personal history of nicotine dependence: Secondary | ICD-10-CM | POA: Diagnosis not present

## 2014-05-06 DIAGNOSIS — N898 Other specified noninflammatory disorders of vagina: Secondary | ICD-10-CM | POA: Insufficient documentation

## 2014-05-06 DIAGNOSIS — K5900 Constipation, unspecified: Secondary | ICD-10-CM | POA: Insufficient documentation

## 2014-05-06 DIAGNOSIS — O99891 Other specified diseases and conditions complicating pregnancy: Secondary | ICD-10-CM | POA: Insufficient documentation

## 2014-05-06 DIAGNOSIS — R109 Unspecified abdominal pain: Secondary | ICD-10-CM | POA: Insufficient documentation

## 2014-05-06 DIAGNOSIS — O26899 Other specified pregnancy related conditions, unspecified trimester: Secondary | ICD-10-CM

## 2014-05-06 DIAGNOSIS — O9989 Other specified diseases and conditions complicating pregnancy, childbirth and the puerperium: Principal | ICD-10-CM

## 2014-05-06 LAB — URINALYSIS, ROUTINE W REFLEX MICROSCOPIC
BILIRUBIN URINE: NEGATIVE
Glucose, UA: NEGATIVE mg/dL
Hgb urine dipstick: NEGATIVE
KETONES UR: NEGATIVE mg/dL
Leukocytes, UA: NEGATIVE
NITRITE: NEGATIVE
PROTEIN: NEGATIVE mg/dL
SPECIFIC GRAVITY, URINE: 1.01 (ref 1.005–1.030)
UROBILINOGEN UA: 0.2 mg/dL (ref 0.0–1.0)
pH: 6 (ref 5.0–8.0)

## 2014-05-06 LAB — WET PREP, GENITAL
Clue Cells Wet Prep HPF POC: NONE SEEN
TRICH WET PREP: NONE SEEN
WBC, Wet Prep HPF POC: NONE SEEN
Yeast Wet Prep HPF POC: NONE SEEN

## 2014-05-06 MED ORDER — ONDANSETRON 8 MG PO TBDP
8.0000 mg | ORAL_TABLET | Freq: Once | ORAL | Status: AC
  Administered 2014-05-06: 8 mg via ORAL
  Filled 2014-05-06: qty 1
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
## 2014-05-06 NOTE — MAU Note (Signed)
Patient states she has had vaginal discharge, abdominal and back pain since 1000 this am. Intercourse at 0800. Denies bleeding, nausea but no vomiting.

## 2014-05-26 ENCOUNTER — Inpatient Hospital Stay (HOSPITAL_COMMUNITY)
Admission: AD | Admit: 2014-05-26 | Discharge: 2014-05-26 | Disposition: A | Discharge status: Home / Self Care | Payer: Medicaid Other | Source: Ambulatory Visit | Attending: Obstetrics | Admitting: Obstetrics

## 2014-05-26 ENCOUNTER — Encounter (HOSPITAL_COMMUNITY): Payer: Self-pay | Admitting: *Deleted

## 2014-05-26 DIAGNOSIS — O219 Vomiting of pregnancy, unspecified: Secondary | ICD-10-CM

## 2014-05-26 DIAGNOSIS — O210 Mild hyperemesis gravidarum: Secondary | ICD-10-CM | POA: Insufficient documentation

## 2014-05-26 LAB — URINALYSIS, ROUTINE W REFLEX MICROSCOPIC
Bilirubin Urine: NEGATIVE
GLUCOSE, UA: NEGATIVE mg/dL
Hgb urine dipstick: NEGATIVE
Ketones, ur: NEGATIVE mg/dL
LEUKOCYTES UA: NEGATIVE
Nitrite: NEGATIVE
PH: 8.5 — AB (ref 5.0–8.0)
Protein, ur: NEGATIVE mg/dL
SPECIFIC GRAVITY, URINE: 1.015 (ref 1.005–1.030)
Urobilinogen, UA: 0.2 mg/dL (ref 0.0–1.0)

## 2014-05-26 MED ORDER — PROMETHAZINE HCL 25 MG RE SUPP: 25.0000 mg | Freq: Four times a day (QID) | RECTAL | Status: DC | PRN

## 2014-05-26 MED ORDER — PROMETHAZINE HCL 25 MG/ML IJ SOLN
25.0000 mg | Freq: Once | INTRAMUSCULAR | Status: AC
  Administered 2014-05-26: 25 mg via INTRAMUSCULAR
  Filled 2014-05-26: qty 1

## 2014-05-26 NOTE — Discharge Instructions (Signed)
Morning Sickness Morning sickness is when you feel sick to your stomach (nauseous) during pregnancy. This nauseous feeling may or may not come with vomiting. It often occurs in the morning but can be a problem any time of day. Morning sickness is most common during the first trimester, but it may continue throughout pregnancy. While morning sickness is unpleasant, it is usually harmless unless you develop severe and continual vomiting (hyperemesis gravidarum). This condition requires more intense treatment.  CAUSES  The cause of morning sickness is not completely known but seems to be related to normal hormonal changes that occur in pregnancy. RISK FACTORS You are at greater risk if you:  Experienced nausea or vomiting before your pregnancy.  Had morning sickness during a previous pregnancy.  Are pregnant with more than one baby, such as twins. TREATMENT  Do not use any medicines (prescription, over-the-counter, or herbal) for morning sickness without first talking to your health care provider. Your health care provider may prescribe or recommend:  Vitamin B6 supplements.  Anti-nausea medicines.  The herbal medicine ginger. HOME CARE INSTRUCTIONS   Only take over-the-counter or prescription medicines as directed by your health care provider.  Taking multivitamins before getting pregnant can prevent or decrease the severity of morning sickness in most women.  Eat a piece of dry toast or unsalted crackers before getting out of bed in the morning.  Eat five or six small meals a day.  Eat dry and bland foods (rice, baked potato). Foods high in carbohydrates are often helpful.  Do not drink liquids with your meals. Drink liquids between meals.  Avoid greasy, fatty, and spicy foods.  Get someone to cook for you if the smell of any food causes nausea and vomiting.  If you feel nauseous after taking prenatal vitamins, take the vitamins at night or with a snack.  Snack on protein  foods (nuts, yogurt, cheese) between meals if you are hungry.  Eat unsweetened gelatins for desserts.  Wearing an acupressure wristband (worn for sea sickness) may be helpful.  Acupuncture may be helpful.  Do not smoke.  Get a humidifier to keep the air in your house free of odors.  Get plenty of fresh air. SEEK MEDICAL CARE IF:   Your home remedies are not working, and you need medicine.  You feel dizzy or lightheaded.  You are losing weight. SEEK IMMEDIATE MEDICAL CARE IF:   You have persistent and uncontrolled nausea and vomiting.  You pass out (faint). MAKE SURE YOU:  Understand these instructions.  Will watch your condition.  Will get help right away if you are not doing well or get worse. Document Released: 11/14/2006 Document Revised: 09/28/2013 Document Reviewed: 03/10/2013 [facility] Patient Information 2015 [place], [place]. This information is not intended to replace advice given to you by your health care provider. Make sure you discuss any questions you have with your health care provider.  Hyperemesis Gravidarum Hyperemesis gravidarum is a severe form of nausea and vomiting that happens during pregnancy. Hyperemesis is worse than morning sickness. It may cause you to have nausea or vomiting all day for many days. It may keep you from eating and drinking enough food and liquids. Hyperemesis usually occurs during the first half (the first 20 weeks) of pregnancy. It often goes away once a woman is in her second half of pregnancy. However, sometimes hyperemesis continues through an entire pregnancy.  CAUSES  The cause of this condition is not completely known but is thought to be related to changes in  the body's hormones when pregnant. It could be from the high level of the pregnancy hormone or an increase in estrogen in the body.  SIGNS AND SYMPTOMS   Severe nausea and vomiting.  Nausea that does not go away.  Vomiting that does not allow you to keep any food  down.  Weight loss and body fluid loss (dehydration).  Having no desire to eat or not liking food you have previously enjoyed. DIAGNOSIS  Your health care provider will do a physical exam and ask you about your symptoms. He or she may also order blood tests and urine tests to make sure something else is not causing the problem.  TREATMENT  You may only need medicine to control the problem. If medicines do not control the nausea and vomiting, you will be treated in the hospital to prevent dehydration, increased acid in the blood (acidosis), weight loss, and changes in the electrolytes in your body that may harm the unborn baby (fetus). You may need IV fluids.  HOME CARE INSTRUCTIONS   Only take over-the-counter or prescription medicines as directed by your health care provider.  Try eating a couple of dry crackers or toast in the morning before getting out of bed.  Avoid foods and smells that upset your stomach.  Avoid fatty and spicy foods.  Eat 5-6 small meals a day.  Do not drink when eating meals. Drink between meals.  For snacks, eat high-protein foods, such as cheese.  Eat or suck on things that have ginger in them. Ginger helps nausea.  Avoid food preparation. The smell of food can spoil your appetite.  Avoid iron pills and iron in your multivitamins until after 3-4 months of being pregnant. However, consult with your health care provider before stopping any prescribed iron pills. SEEK MEDICAL CARE IF:   Your abdominal pain increases.  You have a severe headache.  You have vision problems.  You are losing weight. SEEK IMMEDIATE MEDICAL CARE IF:   You are unable to keep fluids down.  You vomit blood.  You have constant nausea and vomiting.  You have excessive weakness.  You have extreme thirst.  You have dizziness or fainting.  You have a fever or persistent symptoms for more than 2-3 days.  You have a fever and your symptoms suddenly get worse. MAKE SURE  YOU:   Understand these instructions.  Will watch your condition.  Will get help right away if you are not doing well or get worse. Document Released: 09/23/2005 Document Revised: 07/14/2013 Document Reviewed: 05/05/2013 [facility] Patient Information 2015 [place], [place]. This information is not intended to replace advice given to you by your health care provider. Make sure you discuss any questions you have with your health care provider.  Morning Sickness Morning sickness is when you feel sick to your stomach (nauseous) during pregnancy. You may feel sick to your stomach and throw up (vomit). You may feel sick in the morning, but you can feel this way any time of day. Some women feel very sick to their stomach and cannot stop throwing up (hyperemesis gravidarum). HOME CARE  Only take medicines as told by your doctor.  Take multivitamins as told by your doctor. Taking multivitamins before getting pregnant can stop or lessen the harshness of morning sickness.  Eat dry toast or unsalted crackers before getting out of bed.  Eat 5 to 6 small meals a day.  Eat dry and bland foods like rice and baked potatoes.  Do not drink liquids with  meals. Drink between meals.  Do not eat greasy, fatty, or spicy foods.  Have someone cook for you if the smell of food causes you to feel sick or throw up.  If you feel sick to your stomach after taking prenatal vitamins, take them at night or with a snack.  Eat protein when you need a snack (nuts, yogurt, cheese).  Eat unsweetened gelatins for dessert.  Wear a bracelet used for sea sickness (acupressure wristband).  Go to a doctor that puts thin needles into certain body points (acupuncture) to improve how you feel.  Do not smoke.  Use a humidifier to keep the air in your house free of odors.  Get lots of fresh air. GET HELP IF:  You need medicine to feel better.  You feel dizzy or lightheaded.  You are losing weight. GET HELP RIGHT AWAY  IF:   You feel very sick to your stomach and cannot stop throwing up.  You pass out (faint). MAKE SURE YOU:  Understand these instructions.  Will watch your condition.  Will get help right away if you are not doing well or get worse. Document Released: 10/31/2004 Document Revised: 09/28/2013 Document Reviewed: 03/10/2013 [facility] Patient Information 2015 [place], [place]. This information is not intended to replace advice given to you by your health care provider. Make sure you discuss any questions you have with your health care provider.

## 2014-05-26 NOTE — MAU Provider Note (Signed)
History     CSN: [id number]  Arrival date and time: 05/26/14 [id number]   First Provider Initiated Contact with Patient 05/26/14 [phone]      Chief Complaint  Patient presents with  . Emesis During Pregnancy   HPI Comments: [name] [name] 33 y.o. [id number] presents to MAU with nausea and vomiting in pregnancy. She last took phenergan orally at midnight and zofran at 6 am and then threw up. She has been unable to eat since yesterday morning, +FHT     Past Medical History  Diagnosis Date  . Chlamydia   . Abnormal Pap smear   . Kidney stones   . Headache(784.0)     migraines  . Gonorrhea in pregnancy     Past Surgical History  Procedure Laterality Date  . Colposcopy    . Leep    . Dilation and evacuation  05/07/2011    Procedure: DILATATION AND EVACUATION (D&E);  Surgeon: Scheryl DarterJames Arnold, MD;  Location: [facility];  Service: Gynecology;  Laterality: N/A;  . Dilation and curettage of uterus N/A 09/28/2013    Procedure: DILATATION AND EVACUATION FOR RETAINED PLACENTA;  Surgeon: Tereso NewcomerUgonna A Anyanwu, MD;  Location: [facility];  Service: Gynecology;  Laterality: N/A;    Family History  Problem Relation Age of Onset  . Cancer Maternal Grandmother   . Depression Maternal Grandmother   . Cancer Maternal Grandfather   . Depression Maternal Grandfather   . Cancer Paternal Grandmother   . Depression Paternal Grandmother   . Cancer Paternal Grandfather   . Depression Paternal Grandfather     History  Substance Use Topics  . Smoking status: Former Smoker -- 0.25 packs/day    Types: Cigarettes    Quit date: 03/26/2012  . Smokeless tobacco: Never Used  . Alcohol Use: No    Allergies: No Known Allergies  Prescriptions prior to admission  Medication Sig Dispense Refill  . acetaminophen (TYLENOL) 325 MG tablet Take 650 mg by mouth every 6 (six) hours as needed.      . ondansetron (ZOFRAN-ODT) 4 MG disintegrating tablet Take 4 mg by mouth every 8 (eight) hours as needed for nausea or vomiting.       . Prenatal Vit-Fe Fumarate-FA (PRENATAL MULTIVITAMIN) TABS tablet Take 1 tablet by mouth daily at 12 noon.      . promethazine (PHENERGAN) 12.5 MG tablet Take 1 tablet (12.5 mg total) by mouth every 6 (six) hours as needed for nausea or vomiting.  30 tablet  0    Review of Systems  Constitutional: Negative.   HENT: Negative.   Eyes: Negative.   Respiratory: Negative.   Cardiovascular: Negative.   Gastrointestinal: Positive for nausea and vomiting.  Genitourinary: Negative.   Skin: Negative.   Neurological: Negative.   Endo/Heme/Allergies: Negative.   Psychiatric/Behavioral: Negative.    Physical Exam   Blood pressure 111/65, pulse 85, temperature 97.6 F (36.4 C), temperature source Oral, resp. rate 18, height 5' 3.5" (1.613 m), weight 78.019 kg (172 lb), last menstrual period 03/01/2014.  Physical Exam  Constitutional: She is oriented to person, place, and time. She appears well-developed and well-nourished.  HENT:  Head: Normocephalic and atraumatic.  Eyes: Pupils are equal, round, and reactive to light.  Cardiovascular: Normal rate, regular rhythm and normal heart sounds.   Respiratory: Effort normal and breath sounds normal.  GI: Soft. Bowel sounds are normal.  Genitourinary:  Not examined  Musculoskeletal: Normal range of motion.  Neurological: She is alert and oriented to person, place, and time.  Skin: Skin is warm.  Psychiatric: She has a normal mood and affect. Her behavior is normal. Judgment and thought content normal.   Results for orders placed during the hospital encounter of 05/26/14 (from the past 24 hour(s))  URINALYSIS, ROUTINE W REFLEX MICROSCOPIC     Status: Abnormal   Collection Time    05/26/14  8:48 AM      Result Value Ref Range   Color, Urine YELLOW  YELLOW   APPearance CLEAR  CLEAR   Specific Gravity, Urine 1.015  1.005 - 1.030   pH 8.5 (*) 5.0 - 8.0   Glucose, UA NEGATIVE  NEGATIVE mg/dL   Hgb urine dipstick NEGATIVE  NEGATIVE   Bilirubin  Urine NEGATIVE  NEGATIVE   Ketones, ur NEGATIVE  NEGATIVE mg/dL   Protein, ur NEGATIVE  NEGATIVE mg/dL   Urobilinogen, UA 0.2  0.0 - 1.0 mg/dL   Nitrite NEGATIVE  NEGATIVE   Leukocytes, UA NEGATIVE  NEGATIVE    MAU Course  Procedures  MDM  Phenergan 25 mg IM now  Assessment and Plan   A: Nausea and vomiting in pregnancy  P: Add Phenergan 25 mg suppository  Follow up with Dr Lilly Cove, Rubbie Battiest 05/26/2014, 9:23 AM

## 2014-05-26 NOTE — MAU Note (Signed)
Vomiting started around 0000, woke up took a phenergan and woke up vomiting an hour later.  Woke up again and took a Zofran at 0600 and started vomiting again.  Denies VB/LOF.

## 2014-08-08 ENCOUNTER — Encounter (HOSPITAL_COMMUNITY): Payer: Self-pay | Admitting: *Deleted

## 2014-09-07 LAB — OB RESULTS CONSOLE HEPATITIS B SURFACE ANTIGEN: Hepatitis B Surface Ag: NEGATIVE

## 2014-09-07 LAB — OB RESULTS CONSOLE ABO/RH: RH Type: POSITIVE

## 2014-09-07 LAB — OB RESULTS CONSOLE HIV ANTIBODY (ROUTINE TESTING): HIV: NONREACTIVE

## 2014-09-07 LAB — OB RESULTS CONSOLE GC/CHLAMYDIA
Chlamydia: NEGATIVE
Gonorrhea: NEGATIVE

## 2014-09-07 LAB — OB RESULTS CONSOLE GBS: GBS: NEGATIVE

## 2014-09-07 LAB — OB RESULTS CONSOLE ANTIBODY SCREEN: ANTIBODY SCREEN: NEGATIVE

## 2014-09-07 LAB — OB RESULTS CONSOLE RUBELLA ANTIBODY, IGM: RUBELLA: IMMUNE

## 2014-09-07 LAB — OB RESULTS CONSOLE RPR: RPR: NONREACTIVE

## 2014-09-14 ENCOUNTER — Encounter (HOSPITAL_COMMUNITY): Payer: Self-pay | Admitting: General Practice

## 2014-09-14 ENCOUNTER — Inpatient Hospital Stay (HOSPITAL_COMMUNITY)
Admission: AD | Admit: 2014-09-14 | Discharge: 2014-09-14 | Disposition: A | Discharge status: Home / Self Care | Payer: Medicaid Other | Source: Ambulatory Visit | Attending: Obstetrics | Admitting: Obstetrics

## 2014-09-14 DIAGNOSIS — R110 Nausea: Secondary | ICD-10-CM | POA: Diagnosis not present

## 2014-09-14 DIAGNOSIS — Z87891 Personal history of nicotine dependence: Secondary | ICD-10-CM | POA: Diagnosis not present

## 2014-09-14 DIAGNOSIS — Z3A28 28 weeks gestation of pregnancy: Secondary | ICD-10-CM | POA: Insufficient documentation

## 2014-09-14 DIAGNOSIS — O9989 Other specified diseases and conditions complicating pregnancy, childbirth and the puerperium: Secondary | ICD-10-CM

## 2014-09-14 DIAGNOSIS — O368130 Decreased fetal movements, third trimester, not applicable or unspecified: Secondary | ICD-10-CM | POA: Insufficient documentation

## 2014-09-14 DIAGNOSIS — N949 Unspecified condition associated with female genital organs and menstrual cycle: Secondary | ICD-10-CM

## 2014-09-14 LAB — URINALYSIS, ROUTINE W REFLEX MICROSCOPIC
Bilirubin Urine: NEGATIVE
GLUCOSE, UA: NEGATIVE mg/dL
Ketones, ur: NEGATIVE mg/dL
LEUKOCYTES UA: NEGATIVE
Nitrite: NEGATIVE
PH: 6 (ref 5.0–8.0)
Protein, ur: NEGATIVE mg/dL
SPECIFIC GRAVITY, URINE: 1.02 (ref 1.005–1.030)
Urobilinogen, UA: 0.2 mg/dL (ref 0.0–1.0)

## 2014-09-14 LAB — URINE MICROSCOPIC-ADD ON

## 2014-09-14 MED ORDER — PROMETHAZINE HCL 12.5 MG PO TABS: 12.5000 mg | ORAL_TABLET | Freq: Four times a day (QID) | ORAL | Status: DC | PRN

## 2014-09-14 NOTE — Discharge Instructions (Signed)
Abdominal Pain During Pregnancy °Belly (abdominal) pain is common during pregnancy. Most of the time, it is not a serious problem. Other times, it can be a sign that something is wrong with the pregnancy. Always tell your doctor if you have belly pain. °HOME CARE °Monitor your belly pain for any changes. The following actions may help you feel better: °· Do not have sex (intercourse) or put anything in your vagina until you feel better. °· Rest until your pain stops. °· Drink clear fluids if you feel sick to your stomach (nauseous). Do not eat solid food until you feel better. °· Only take medicine as told by your doctor. °· Keep all doctor visits as told. °GET HELP RIGHT AWAY IF:  °· You are bleeding, leaking fluid, or pieces of tissue come out of your vagina. °· You have more pain or cramping. °· You keep throwing up (vomiting). °· You have pain when you pee (urinate) or have blood in your pee. °· You have a fever. °· You do not feel your baby moving as much. °· You feel very weak or feel like passing out. °· You have trouble breathing, with or without belly pain. °· You have a very bad headache and belly pain. °· You have fluid leaking from your vagina and belly pain. °· You keep having watery poop (diarrhea). °· Your belly pain does not go away after resting, or the pain gets worse. °MAKE SURE YOU:  °· Understand these instructions. °· Will watch your condition. °· Will get help right away if you are not doing well or get worse. °Document Released: 09/11/2009 Document Revised: 05/26/2013 Document Reviewed: 04/22/2013 °[employer]® Patient Information ©2015 [employer]. This information is not intended to replace advice given to you by your health care provider. Make sure you discuss any questions you have with your health care provider. ° °

## 2014-09-14 NOTE — MAU Provider Note (Signed)
History     CSN: [id number]  Arrival date and time: 09/14/14 1949   First Provider Initiated Contact with Patient 09/14/14 2053      Chief Complaint  Patient presents with  . Abdominal Pain  . Decreased Fetal Movement  . Nausea   HPI  [name] is a 33 y.o. [id number] at 5258w1d who presents to MAU today with complaint of nausea x 2 days and lower abdominal pain. The patient denies vomiting or diarrhea. States that she had +N/V earlier in the pregnancy that was controlled with Phenergan, but she has run out of Rx. She also states lower abdominal pain that "feels like a pulled muscle" more prominent in the right side over the area of the round ligament. Patient states that pain is worse with ambulation and change or positions and relieved by support to the area. She denies contractions, vaginal bleeding or LOF. She states decreased fetal movement prior to arrival in MAU, but now endorses normal movement.    OB History    Gravida Para Term Preterm AB TAB SAB Ectopic Multiple Living   10 6 4 2 3  3   6       Past Medical History  Diagnosis Date  . Chlamydia   . Abnormal Pap smear   . Kidney stones   . Headache(784.0)     migraines  . Gonorrhea in pregnancy     Past Surgical History  Procedure Laterality Date  . Colposcopy    . Leep    . Dilation and evacuation  05/07/2011    Procedure: DILATATION AND EVACUATION (D&E);  Surgeon: Scheryl DarterJames Arnold, MD;  Location: [facility];  Service: Gynecology;  Laterality: N/A;  . Dilation and curettage of uterus N/A 09/28/2013    Procedure: DILATATION AND EVACUATION FOR RETAINED PLACENTA;  Surgeon: Tereso NewcomerUgonna A Anyanwu, MD;  Location: [facility];  Service: Gynecology;  Laterality: N/A;    Family History  Problem Relation Age of Onset  . Cancer Maternal Grandmother   . Depression Maternal Grandmother   . Cancer Maternal Grandfather   . Depression Maternal Grandfather   . Cancer Paternal Grandmother   . Depression Paternal Grandmother   . Cancer  Paternal Grandfather   . Depression Paternal Grandfather     History  Substance Use Topics  . Smoking status: Former Smoker -- 0.25 packs/day    Types: Cigarettes    Quit date: 03/26/2012  . Smokeless tobacco: Never Used  . Alcohol Use: No    Allergies: No Known Allergies  No prescriptions prior to admission    Review of Systems  Constitutional: Negative for fever and malaise/fatigue.  Gastrointestinal: Positive for nausea and abdominal pain. Negative for vomiting and diarrhea.  Genitourinary: Negative for dysuria, urgency and frequency.       Neg - vaginal bleeding, LOF   Physical Exam   Blood pressure 120/78, pulse 94, temperature 98.5 F (36.9 C), temperature source Oral, resp. rate 18, height 5\' 3"  (1.6 m), weight 194 lb 12.8 oz (88.361 kg), last menstrual period 03/01/2014, SpO2 100 %, not currently breastfeeding.  Physical Exam  Constitutional: She is oriented to person, place, and time. She appears well-developed and well-nourished. No distress.  HENT:  Head: Normocephalic.  Cardiovascular: Normal rate.   Respiratory: Effort normal.  GI: Soft. She exhibits no distension and no mass. There is no tenderness. There is no rebound and no guarding.  Neurological: She is alert and oriented to person, place, and time.  Skin: Skin is warm and dry.  No erythema.  Psychiatric: She has a normal mood and affect.   Results for orders placed or performed during the hospital encounter of 09/14/14 (from the past 24 hour(s))  Urinalysis, Routine w reflex microscopic     Status: Abnormal   Collection Time: 09/14/14  8:06 PM  Result Value Ref Range   Color, Urine YELLOW YELLOW   APPearance CLEAR CLEAR   Specific Gravity, Urine 1.020 1.005 - 1.030   pH 6.0 5.0 - 8.0   Glucose, UA NEGATIVE NEGATIVE mg/dL   Hgb urine dipstick TRACE (A) NEGATIVE   Bilirubin Urine NEGATIVE NEGATIVE   Ketones, ur NEGATIVE NEGATIVE mg/dL   Protein, ur NEGATIVE NEGATIVE mg/dL   Urobilinogen, UA 0.2 0.0  - 1.0 mg/dL   Nitrite NEGATIVE NEGATIVE   Leukocytes, UA NEGATIVE NEGATIVE  Urine microscopic-add on     Status: Abnormal   Collection Time: 09/14/14  8:06 PM  Result Value Ref Range   Squamous Epithelial / LPF FEW (A) RARE   WBC, UA 3-6 <3 WBC/hpf   RBC / HPF 3-6 <3 RBC/hpf   Bacteria, UA FEW (A) RARE   Fetal Movement: Baseline: 145 bpm, moderate variability, + accelerations, no decelerations Contractions: none  MAU Course  Procedures None  MDM UA today shows no signs of dehydration. Patient is tolerating PO intake.   Assessment and Plan  A: SIUP at 6912w1d Round ligament pain Nausea  P: Discharge home Rx for Phenergan given to patient Discussed abdominal binder, warm bath/shower and moderation of activity for pain Advised Tylenol PRN pain Patient encouraged to follow-up with Dr. Gaynell FaceMarshall as scheduled for routine prenatal care or sooner if symptoms worsen Preterm labor precautions discussed Patient may return to MAU as needed or if her condition were to change or worsen    Marny LowensteinJulie N Wenzel, PA-C  09/14/2014, 10:34 PM

## 2014-09-14 NOTE — MAU Note (Signed)
Nausea x 2 days; unable to eat due to nausea but has not vomited. Denies vaginal bleeding/LOF/vaginal discharge. Decreased fetal movement x 2 days. Intermittent abdominal pain x 2 days; unsure how frequent.

## 2014-10-07 NOTE — L&D Delivery Note (Signed)
Delivery Note At 7:43 AM a viable female was delivered via Vaginal, Spontaneous Delivery (Presentation: ; Occiput Anterior).  APGAR: 9, 9; weight  .   Placenta status: , Spontaneous.  Cord: 3 vessels with the following complications: None.  Cord pH: none  Anesthesia:   Episiotomy: None Lacerations: None Suture Repair: none Est. Blood Loss (mL): 350  Mom to postpartum.  Baby to Couplet care / Skin to Skin.  Yianni Skilling A 12/03/2014, 8:05 AM

## 2014-10-17 ENCOUNTER — Encounter (HOSPITAL_COMMUNITY): Payer: Self-pay | Admitting: *Deleted

## 2014-10-17 ENCOUNTER — Inpatient Hospital Stay (HOSPITAL_COMMUNITY)
Admission: AD | Admit: 2014-10-17 | Discharge: 2014-10-18 | Disposition: A | Discharge status: Home / Self Care | Payer: Medicaid Other | Source: Ambulatory Visit | Attending: Obstetrics | Admitting: Obstetrics

## 2014-10-17 DIAGNOSIS — O26899 Other specified pregnancy related conditions, unspecified trimester: Secondary | ICD-10-CM

## 2014-10-17 DIAGNOSIS — Z87891 Personal history of nicotine dependence: Secondary | ICD-10-CM | POA: Insufficient documentation

## 2014-10-17 DIAGNOSIS — N949 Unspecified condition associated with female genital organs and menstrual cycle: Secondary | ICD-10-CM | POA: Insufficient documentation

## 2014-10-17 DIAGNOSIS — Z3A33 33 weeks gestation of pregnancy: Secondary | ICD-10-CM | POA: Insufficient documentation

## 2014-10-17 DIAGNOSIS — O9989 Other specified diseases and conditions complicating pregnancy, childbirth and the puerperium: Secondary | ICD-10-CM | POA: Insufficient documentation

## 2014-10-17 DIAGNOSIS — R102 Pelvic and perineal pain: Secondary | ICD-10-CM

## 2014-10-17 DIAGNOSIS — Z87442 Personal history of urinary calculi: Secondary | ICD-10-CM | POA: Insufficient documentation

## 2014-10-17 NOTE — MAU Note (Signed)
Pt reports she has been having contractions for the last few days.

## 2014-10-18 DIAGNOSIS — Z87891 Personal history of nicotine dependence: Secondary | ICD-10-CM | POA: Diagnosis not present

## 2014-10-18 DIAGNOSIS — Z87442 Personal history of urinary calculi: Secondary | ICD-10-CM | POA: Diagnosis not present

## 2014-10-18 DIAGNOSIS — O9989 Other specified diseases and conditions complicating pregnancy, childbirth and the puerperium: Secondary | ICD-10-CM

## 2014-10-18 DIAGNOSIS — N949 Unspecified condition associated with female genital organs and menstrual cycle: Secondary | ICD-10-CM | POA: Diagnosis not present

## 2014-10-18 DIAGNOSIS — Z3A33 33 weeks gestation of pregnancy: Secondary | ICD-10-CM | POA: Diagnosis not present

## 2014-10-18 DIAGNOSIS — R1031 Right lower quadrant pain: Secondary | ICD-10-CM | POA: Diagnosis present

## 2014-10-18 LAB — URINALYSIS, ROUTINE W REFLEX MICROSCOPIC
BILIRUBIN URINE: NEGATIVE
GLUCOSE, UA: NEGATIVE mg/dL
Hgb urine dipstick: NEGATIVE
KETONES UR: NEGATIVE mg/dL
LEUKOCYTES UA: NEGATIVE
Nitrite: NEGATIVE
PROTEIN: NEGATIVE mg/dL
Specific Gravity, Urine: 1.02 (ref 1.005–1.030)
Urobilinogen, UA: 0.2 mg/dL (ref 0.0–1.0)
pH: 6.5 (ref 5.0–8.0)

## 2014-10-18 LAB — CBC
HEMATOCRIT: 33.5 % — AB (ref 36.0–46.0)
HEMOGLOBIN: 11.5 g/dL — AB (ref 12.0–15.0)
MCH: 31.4 pg (ref 26.0–34.0)
MCHC: 34.3 g/dL (ref 30.0–36.0)
MCV: 91.5 fL (ref 78.0–100.0)
Platelets: 172 10*3/uL (ref 150–400)
RBC: 3.66 MIL/uL — AB (ref 3.87–5.11)
RDW: 14.4 % (ref 11.5–15.5)
WBC: 8.7 10*3/uL (ref 4.0–10.5)

## 2014-10-18 NOTE — Discharge Instructions (Signed)

## 2014-10-18 NOTE — MAU Provider Note (Signed)
History     CSN: [id number]  Arrival date and time: 10/17/14 2333   First Provider Initiated Contact with Patient 10/18/14 0027      Chief Complaint  Patient presents with  . Contractions   HPI   [name] MeyerMarsha [name] is a 34 y.o. [id number] at 8135w0d who presents with contractions and RLQ pain.  States has had contractions throughout this pregnancy but has been worse the last 2 days. Can't quantify frequency, states has them back to back, then they space out. Rates 4/10.  RLQ pain since yesterday; constant pain that she rates as 7/10. Denies alleviating or aggravating factors.  Denies fever/vaginal bleeding/LOF. Positive fetal movement.  Reports nausea, no vomiting. Denies diarrhea. Reports constipation throughout pregnancy; last BM today.      OB History    Gravida Para Term Preterm AB TAB SAB Ectopic Multiple Living   10 6 4 2 3  3   6       Past Medical History  Diagnosis Date  . Chlamydia   . Abnormal Pap smear   . Kidney stones   . Headache(784.0)     migraines  . Gonorrhea in pregnancy     Past Surgical History  Procedure Laterality Date  . Colposcopy    . Leep    . Dilation and evacuation  05/07/2011    Procedure: DILATATION AND EVACUATION (D&E);  Surgeon: Scheryl DarterJames Arnold, MD;  Location: [facility];  Service: Gynecology;  Laterality: N/A;  . Dilation and curettage of uterus N/A 09/28/2013    Procedure: DILATATION AND EVACUATION FOR RETAINED PLACENTA;  Surgeon: Tereso NewcomerUgonna A Anyanwu, MD;  Location: [facility];  Service: Gynecology;  Laterality: N/A;    Family History  Problem Relation Age of Onset  . Cancer Maternal Grandmother   . Depression Maternal Grandmother   . Cancer Maternal Grandfather   . Depression Maternal Grandfather   . Cancer Paternal Grandmother   . Depression Paternal Grandmother   . Cancer Paternal Grandfather   . Depression Paternal Grandfather     History  Substance Use Topics  . Smoking status: Former Smoker -- 0.25 packs/day    Types: Cigarettes   Quit date: 03/26/2012  . Smokeless tobacco: Never Used  . Alcohol Use: No    Allergies: No Known Allergies  Prescriptions prior to admission  Medication Sig Dispense Refill Last Dose  . acetaminophen (TYLENOL) 325 MG tablet Take 650 mg by mouth every 6 (six) hours as needed for headache.    Past Week at Unknown time  . acetaminophen-codeine (TYLENOL #3) 300-30 MG per tablet Take 1 tablet by mouth every 4 (four) hours as needed for moderate pain.   Past Week at Unknown time  . ferrous sulfate 325 (65 FE) MG tablet Take 325 mg by mouth daily with breakfast.   10/17/2014 at Unknown time  . Prenatal Vit-Fe Fumarate-FA (PRENATAL MULTIVITAMIN) TABS tablet Take 1 tablet by mouth daily at 12 noon.   Past Week at Unknown time  . promethazine (PHENERGAN) 12.5 MG tablet Take 1 tablet (12.5 mg total) by mouth every 6 (six) hours as needed for nausea or vomiting. 30 tablet 0 Past Week at Unknown time  . aspirin-acetaminophen-caffeine (EXCEDRIN MIGRAINE) 250-250-65 MG per tablet Take 1 tablet by mouth every 6 (six) hours as needed for headache.   More than a month at Unknown time  . calcium carbonate (TUMS - DOSED IN MG ELEMENTAL CALCIUM) 500 MG chewable tablet Chew 1 tablet by mouth 2 (two) times daily as needed for indigestion or  heartburn.   More than a month at Unknown time  . promethazine (PHENERGAN) 25 MG suppository Place 1 suppository (25 mg total) rectally every 6 (six) hours as needed for nausea or vomiting. 12 each 2 never    Review of Systems  Constitutional: Negative for fever.  Gastrointestinal: Positive for nausea, abdominal pain and constipation. Negative for vomiting and diarrhea.  Genitourinary: Negative for dysuria.       Denies vaginal bleeding or LOF.  Positive for vaginal discharge; small amount of yellow mucous like discharge. Denies vaginal irritation.    Physical Exam   Blood pressure 131/88, pulse 107, temperature 97.5 F (36.4 C), temperature source Oral, resp. rate 20, last  menstrual period 03/01/2014, not currently breastfeeding.  Physical Exam  Constitutional: She is oriented to person, place, and time. She appears well-developed and well-nourished. No distress.  Cardiovascular: Normal rate, regular rhythm and normal heart sounds.   No murmur heard. Respiratory: Effort normal and breath sounds normal. She has no wheezes.  GI: There is tenderness. There is no rebound.  RLQ tenderness  Genitourinary: Vagina normal.  SVE 3/40/-3  Neurological: She is alert and oriented to person, place, and time.  Skin: Skin is warm and dry. She is not diaphoretic.  Psychiatric: She has a normal mood and affect. Her behavior is normal.   EFM: baseline 135, +accels, no decels. No contractions.   Results for orders placed or performed during the hospital encounter of 10/17/14 (from the past 24 hour(s))  Urinalysis, Routine w reflex microscopic     Status: None   Collection Time: 10/18/14 12:36 AM  Result Value Ref Range   Color, Urine YELLOW YELLOW   APPearance CLEAR CLEAR   Specific Gravity, Urine 1.020 1.005 - 1.030   pH 6.5 5.0 - 8.0   Glucose, UA NEGATIVE NEGATIVE mg/dL   Hgb urine dipstick NEGATIVE NEGATIVE   Bilirubin Urine NEGATIVE NEGATIVE   Ketones, ur NEGATIVE NEGATIVE mg/dL   Protein, ur NEGATIVE NEGATIVE mg/dL   Urobilinogen, UA 0.2 0.0 - 1.0 mg/dL   Nitrite NEGATIVE NEGATIVE   Leukocytes, UA NEGATIVE NEGATIVE  CBC     Status: Abnormal   Collection Time: 10/18/14  1:00 AM  Result Value Ref Range   WBC 8.7 4.0 - 10.5 K/uL   RBC 3.66 (L) 3.87 - 5.11 MIL/uL   Hemoglobin 11.5 (L) 12.0 - 15.0 g/dL   HCT 16.1 (L) 09.6 - 04.5 %   MCV 91.5 78.0 - 100.0 fL   MCH 31.4 26.0 - 34.0 pg   MCHC 34.3 30.0 - 36.0 g/dL   RDW 40.9 81.1 - 91.4 %   Platelets 172 150 - 400 K/uL     MAU Course  Procedures  MDM CBC No ctx on monitor Category I tracing 0130 - discussed pt with Dr. Gaynell Face - ok to d/c home  Assessment and Plan   1. Pain of round ligament  affecting pregnancy, antepartum     Plan D/c home Next appt with Dr. Gaynell Face in 2 days Discussed reasons to return to MAU   Judeth Horn B 10/18/2014, 12:29 AM   I have seen the patient with the resident/student and agree with the above.  Tawnya Crook

## 2014-10-26 ENCOUNTER — Inpatient Hospital Stay (HOSPITAL_COMMUNITY)
Admission: AD | Admit: 2014-10-26 | Discharge: 2014-10-26 | Disposition: A | Discharge status: Home / Self Care | Payer: Medicaid Other | Source: Ambulatory Visit | Attending: Obstetrics | Admitting: Obstetrics

## 2014-10-26 ENCOUNTER — Encounter (HOSPITAL_COMMUNITY): Payer: Self-pay | Admitting: *Deleted

## 2014-10-26 DIAGNOSIS — O09213 Supervision of pregnancy with history of pre-term labor, third trimester: Secondary | ICD-10-CM | POA: Insufficient documentation

## 2014-10-26 DIAGNOSIS — O4703 False labor before 37 completed weeks of gestation, third trimester: Secondary | ICD-10-CM | POA: Diagnosis not present

## 2014-10-26 DIAGNOSIS — Z87891 Personal history of nicotine dependence: Secondary | ICD-10-CM | POA: Insufficient documentation

## 2014-10-26 DIAGNOSIS — Z3A34 34 weeks gestation of pregnancy: Secondary | ICD-10-CM | POA: Diagnosis not present

## 2014-10-26 LAB — URINALYSIS, ROUTINE W REFLEX MICROSCOPIC
BILIRUBIN URINE: NEGATIVE
GLUCOSE, UA: NEGATIVE mg/dL
Hgb urine dipstick: NEGATIVE
Ketones, ur: >80 mg/dL — AB
NITRITE: NEGATIVE
PH: 6 (ref 5.0–8.0)
Protein, ur: NEGATIVE mg/dL
SPECIFIC GRAVITY, URINE: 1.025 (ref 1.005–1.030)
Urobilinogen, UA: 0.2 mg/dL (ref 0.0–1.0)

## 2014-10-26 LAB — URINE MICROSCOPIC-ADD ON

## 2014-10-26 MED ORDER — NIFEDIPINE 10 MG PO CAPS
10.0000 mg | ORAL_CAPSULE | Freq: Once | ORAL | Status: AC
  Administered 2014-10-26: 10 mg via ORAL
  Filled 2014-10-26: qty 1

## 2014-10-26 NOTE — MAU Note (Signed)
Pt complaining of having contractions off and on all day. Pt states she called Dr Clearance CootsHarper around [phone] and was told to come in for evaluation

## 2014-10-26 NOTE — MAU Provider Note (Signed)
History     CSN: [id number]  Arrival date and time: 10/26/14 [id number]   First Provider Initiated Contact with Patient 10/26/14 0051      Chief Complaint  Patient presents with  . Contractions   HPI  [name] is a 34 y.o. [id number] at 505w1d who presents to MAU today with complaint of preterm contractions. The patient states a history of 2 previous PTD. She states tonight she noted contractions q 6 minutes prior to arrival. She also states less FM x 2 days prior to arrival, but endorses normal movement while in MAU. She denies vaginal bleeding, but states possible LOF. She has noted a small amount of wetness in her underwear today. She denies any gushes of fluid. She denies complications with the pregnancy.   OB History    Gravida Para Term Preterm AB TAB SAB Ectopic Multiple Living   10 6 4 2 2  2   6       Past Medical History  Diagnosis Date  . Chlamydia   . Abnormal Pap smear   . Kidney stones   . Headache(784.0)     migraines  . Gonorrhea in pregnancy     Past Surgical History  Procedure Laterality Date  . Colposcopy    . Leep    . Dilation and evacuation  05/07/2011    Procedure: DILATATION AND EVACUATION (D&E);  Surgeon: Scheryl DarterJames Arnold, MD;  Location: [facility];  Service: Gynecology;  Laterality: N/A;  . Dilation and curettage of uterus N/A 09/28/2013    Procedure: DILATATION AND EVACUATION FOR RETAINED PLACENTA;  Surgeon: Tereso NewcomerUgonna A Anyanwu, MD;  Location: [facility];  Service: Gynecology;  Laterality: N/A;    Family History  Problem Relation Age of Onset  . Cancer Maternal Grandmother   . Depression Maternal Grandmother   . Cancer Maternal Grandfather   . Depression Maternal Grandfather   . Cancer Paternal Grandmother   . Depression Paternal Grandmother   . Cancer Paternal Grandfather   . Depression Paternal Grandfather     History  Substance Use Topics  . Smoking status: Former Smoker -- 0.25 packs/day    Types: Cigarettes    Quit date: 03/26/2012  . Smokeless  tobacco: Never Used  . Alcohol Use: No    Allergies: Not on File  No prescriptions prior to admission    Review of Systems  Constitutional: Negative for malaise/fatigue.  Gastrointestinal: Positive for abdominal pain.  Genitourinary:       Neg - vaginal bleeding + vaginal discharge, LOF   Physical Exam   Blood pressure 114/76, pulse 95, temperature 98 F (36.7 C), temperature source Oral, resp. rate 16, height 5\' 3"  (1.6 m), weight 201 lb (91.173 kg), last menstrual period 03/01/2014, not currently breastfeeding.  Physical Exam  Constitutional: She is oriented to person, place, and time. She appears well-developed and well-nourished. No distress.  HENT:  Head: Normocephalic.  Cardiovascular: Normal rate.   Respiratory: Effort normal.  GI: Soft.  Genitourinary: No bleeding in the vagina. Vaginal discharge (small amount of thin, white discharge noted) found.  Neg - pooling  Neurological: She is alert and oriented to person, place, and time.  Skin: Skin is warm and dry. No erythema.  Psychiatric: She has a normal mood and affect.  Dilation: 3 Effacement (%): Thick Cervical Position: Posterior Station: -3 Exam by:: Harlon FlorJ. Wenzel NP  Results for orders placed or performed during the hospital encounter of 10/26/14 (from the past 24 hour(s))  Urinalysis, Routine w reflex microscopic  Status: Abnormal   Collection Time: 10/26/14 12:35 AM  Result Value Ref Range   Color, Urine YELLOW YELLOW   APPearance CLEAR CLEAR   Specific Gravity, Urine 1.025 1.005 - 1.030   pH 6.0 5.0 - 8.0   Glucose, UA NEGATIVE NEGATIVE mg/dL   Hgb urine dipstick NEGATIVE NEGATIVE   Bilirubin Urine NEGATIVE NEGATIVE   Ketones, ur >80 (A) NEGATIVE mg/dL   Protein, ur NEGATIVE NEGATIVE mg/dL   Urobilinogen, UA 0.2 0.0 - 1.0 mg/dL   Nitrite NEGATIVE NEGATIVE   Leukocytes, UA TRACE (A) NEGATIVE  Urine microscopic-add on     Status: None   Collection Time: 10/26/14 12:35 AM  Result Value Ref Range    Squamous Epithelial / LPF RARE RARE   WBC, UA 0-2 <3 WBC/hpf   RBC / HPF 0-2 <3 RBC/hpf   Bacteria, UA RARE RARE    Fetal Monitoring: Baseline: 140 bpm, moderate variability, + accelerations, no decelerations Contractions: q 5-8 minutes  MAU Course  Procedures None  MDM UA today Fern - negative Procardia given in MAU - contractions have stopped x 30 + minutes prior to discharge. Patient endorses improvement in contractions and pain.  Cervix unchanged after > 1 hour  Assessment and Plan  A: SIUP at [redacted]w[redacted]d History of preterm delivery Preterm contractions  P: Discharge home Preterm labor precautions discussed Patient advised to increased PO hydration Patient advised to follow-up with Dr. Gaynell Face as scheduled for routine prenatal care or sooner if symptoms worsen Patient may return to MAU as needed or if her condition were to change or worsen   Marny Lowenstein, PA-C  10/26/2014, 3:33 AM

## 2014-10-26 NOTE — Discharge Instructions (Signed)
Preterm Labor Information °Preterm labor is when labor starts at less than 37 weeks of pregnancy. The normal length of a pregnancy is 39 to 41 weeks. °CAUSES °Often, there is no identifiable underlying cause as to why a woman goes into preterm labor. One of the most common known causes of preterm labor is infection. Infections of the uterus, cervix, vagina, amniotic sac, bladder, kidney, or even the lungs (pneumonia) can cause labor to start. Other suspected causes of preterm labor include:  °· Urogenital infections, such as yeast infections and bacterial vaginosis.   °· Uterine abnormalities (uterine shape, uterine septum, fibroids, or bleeding from the placenta).   °· A cervix that has been operated on (it may fail to stay closed).   °· Malformations in the fetus.   °· Multiple gestations (twins, triplets, and so on).   °· Breakage of the amniotic sac.   °RISK FACTORS °· Having a previous history of preterm labor.   °· Having premature rupture of membranes (PROM).   °· Having a placenta that covers the opening of the cervix (placenta previa).   °· Having a placenta that separates from the uterus (placental abruption).   °· Having a cervix that is too weak to hold the fetus in the uterus (incompetent cervix).   °· Having too much fluid in the amniotic sac (polyhydramnios).   °· Taking illegal drugs or smoking while pregnant.   °· Not gaining enough weight while pregnant.   °· Being younger than 18 and older than 35 years old.   °· Having a low socioeconomic status.   °· Being African American. °SYMPTOMS °Signs and symptoms of preterm labor include:  °· Menstrual-like cramps, abdominal pain, or back pain. °· Uterine contractions that are regular, as frequent as six in an hour, regardless of their intensity (may be mild or painful). °· Contractions that start on the top of the uterus and spread down to the lower abdomen and back.   °· A sense of increased pelvic pressure.   °· A watery or bloody mucus discharge that  comes from the vagina.   °TREATMENT °Depending on the length of the pregnancy and other circumstances, your health care provider may suggest bed rest. If necessary, there are medicines that can be given to stop contractions and to mature the fetal lungs. If labor happens before 34 weeks of pregnancy, a prolonged hospital stay may be recommended. Treatment depends on the condition of both you and the fetus.  °WHAT SHOULD YOU DO IF YOU THINK YOU ARE IN PRETERM LABOR? °Call your health care provider right away. You will need to go to the hospital to get checked immediately. °HOW CAN YOU PREVENT PRETERM LABOR IN FUTURE PREGNANCIES? °You should:  °· Stop smoking if you smoke.  °· Maintain healthy weight gain and avoid chemicals and drugs that are not necessary. °· Be watchful for any type of infection. °· Inform your health care provider if you have a known history of preterm labor. °Document Released: 12/14/2003 Document Revised: 05/26/2013 Document Reviewed: 10/26/2012 °ExitCare® Patient Information ©2015 [employer]. This information is not intended to replace advice given to you by your health care provider. Make sure you discuss any questions you have with your health care provider. ° ° ° °Braxton Hicks Contractions °Contractions of the uterus can occur throughout pregnancy. Contractions are not always a sign that you are in labor.  °WHAT ARE BRAXTON HICKS CONTRACTIONS?  °Contractions that occur before labor are called Braxton Hicks contractions, or false labor. Toward the end of pregnancy (32-34 weeks), these contractions can develop more often and may become more forceful. This is   labor because these contractions do not result in opening (dilatation) and thinning of the cervix. They are sometimes difficult to tell apart from true labor because these contractions can be forceful and people have different pain tolerances. You should not feel embarrassed if you go to the hospital with false labor. Sometimes,  the only way to tell if you are in true labor is for your health care provider to look for changes in the cervix. If there are no prenatal problems or other health problems associated with the pregnancy, it is completely safe to be sent home with false labor and await the onset of true labor. HOW CAN YOU TELL THE DIFFERENCE BETWEEN TRUE AND FALSE LABOR? False Labor  The contractions of false labor are usually shorter and not as hard as those of true labor.   The contractions are usually irregular.   The contractions are often felt in the front of the lower abdomen and in the groin.   The contractions may go away when you walk around or change positions while lying down.   The contractions get weaker and are shorter lasting as time goes on.   The contractions do not usually become progressively stronger, regular, and closer together as with true labor.  True Labor  Contractions in true labor last 30-70 seconds, become very regular, usually become more intense, and increase in frequency.   The contractions do not go away with walking.   The discomfort is usually felt in the top of the uterus and spreads to the lower abdomen and low back.   True labor can be determined by your health care provider with an exam. This will show that the cervix is dilating and getting thinner.  WHAT TO REMEMBER  Keep up with your usual exercises and follow other instructions given by your health care provider.   Take medicines as directed by your health care provider.   Keep your regular prenatal appointments.   Eat and drink lightly if you think you are going into labor.   If Braxton Hicks contractions are making you uncomfortable:   Change your position from lying down or resting to walking, or from walking to resting.   Sit and rest in a tub of warm water.   Drink 2-3 glasses of water. Dehydration may cause these contractions.   Do slow and deep breathing several times an  hour.  WHEN SHOULD I SEEK IMMEDIATE MEDICAL CARE? Seek immediate medical care if:  Your contractions become stronger, more regular, and closer together.   You have fluid leaking or gushing from your vagina.   You have a fever.   You pass blood-tinged mucus.   You have vaginal bleeding.   You have continuous abdominal pain.   You have low back pain that you never had before.   You feel your baby's head pushing down and causing pelvic pressure.   Your baby is not moving as much as it used to.  Document Released: 09/23/2005 Document Revised: 09/28/2013 Document Reviewed: 07/05/2013 [facility] Patient Information 2015 [place], [place]. This information is not intended to replace advice given to you by your health care provider. Make sure you discuss any questions you have with your health care provider.  Pelvic Rest Pelvic rest is sometimes recommended for women when:   The placenta is partially or completely covering the opening of the cervix (placenta previa).  There is bleeding between the uterine wall and the amniotic sac in the first trimester (subchorionic hemorrhage).  The cervix begins  to open without labor starting (incompetent cervix, cervical insufficiency).  The labor is too early (preterm labor). HOME CARE INSTRUCTIONS  Do not have sexual intercourse, stimulation, or an orgasm.  Do not use tampons, douche, or put anything in the vagina.  Do not lift anything over 10 pounds (4.5 kg).  Avoid strenuous activity or straining your pelvic muscles. SEEK MEDICAL CARE IF:  You have any vaginal bleeding during pregnancy. Treat this as a potential emergency.  You have cramping pain felt low in the stomach (stronger than menstrual cramps).  You notice vaginal discharge (watery, mucus, or bloody).  You have a low, dull backache.  There are regular contractions or uterine tightening. SEEK IMMEDIATE MEDICAL CARE IF: You have vaginal bleeding and have placenta  previa.  Document Released: 01/18/2011 Document Revised: 12/16/2011 Document Reviewed: 01/18/2011 [facility] Patient Information 2015 [place]. This information is not intended to replace advice given to you by your health care provider. Make sure you discuss any questions you have with your health care provider.

## 2014-10-26 NOTE — MAU Note (Signed)
PT  SAYS  HER UC  HURT BAD AT 6PM.     THINKS  SHE STARTED LEAKING   2 PM   PNC  WITH  DR MARSHALL.  WAS  HERE LAST WEEK - VE   2 CM.   DENIES HSV AND MRSA.

## 2014-11-01 ENCOUNTER — Inpatient Hospital Stay (HOSPITAL_COMMUNITY)
Admission: AD | Admit: 2014-11-01 | Discharge: 2014-11-02 | Disposition: A | Discharge status: Home / Self Care | Payer: Medicaid Other | Source: Ambulatory Visit | Attending: Obstetrics | Admitting: Obstetrics

## 2014-11-01 ENCOUNTER — Encounter (HOSPITAL_COMMUNITY): Payer: Self-pay | Admitting: *Deleted

## 2014-11-01 DIAGNOSIS — Z3A35 35 weeks gestation of pregnancy: Secondary | ICD-10-CM | POA: Diagnosis not present

## 2014-11-01 DIAGNOSIS — Z87891 Personal history of nicotine dependence: Secondary | ICD-10-CM | POA: Diagnosis not present

## 2014-11-01 DIAGNOSIS — Z87442 Personal history of urinary calculi: Secondary | ICD-10-CM | POA: Diagnosis not present

## 2014-11-01 DIAGNOSIS — O4703 False labor before 37 completed weeks of gestation, third trimester: Secondary | ICD-10-CM

## 2014-11-01 LAB — URINALYSIS, ROUTINE W REFLEX MICROSCOPIC
Bilirubin Urine: NEGATIVE
Glucose, UA: NEGATIVE mg/dL
Hgb urine dipstick: NEGATIVE
Ketones, ur: NEGATIVE mg/dL
LEUKOCYTES UA: NEGATIVE
Nitrite: NEGATIVE
Protein, ur: NEGATIVE mg/dL
UROBILINOGEN UA: 0.2 mg/dL (ref 0.0–1.0)
pH: 6 (ref 5.0–8.0)

## 2014-11-01 MED ORDER — OXYCODONE-ACETAMINOPHEN 5-325 MG PO TABS
1.0000 | ORAL_TABLET | Freq: Once | ORAL | Status: AC
Start: 2014-11-01 — End: 2014-11-02
  Administered 2014-11-02: 1 via ORAL
  Filled 2014-11-01: qty 1

## 2014-11-01 MED ORDER — PROMETHAZINE HCL 25 MG PO TABS
12.5000 mg | ORAL_TABLET | Freq: Once | ORAL | Status: AC
  Administered 2014-11-02: 12.5 mg via ORAL
  Filled 2014-11-01: qty 1

## 2014-11-01 NOTE — MAU Provider Note (Signed)
History     CSN: [id number]  Arrival date and time: 11/01/14 2238   First Provider Initiated Contact with Patient 11/01/14 2310      Chief Complaint  Patient presents with  . Contractions  . Nausea   HPI  [name] is a 34 y.o. [id number] at [redacted]w[redacted]d who presents to MAU today with complaint of contractions x 3 days that have become worse today. She states that they are more frequent and more painful recently. She has a history of 2 previous PTD and PTL with this pregnancy. She has been 3cm dilated at her most recent visits. She denies vaginal bleeding or LOF. She does report nausea x 2 days without vomiting. She denies diarrhea and states that she had a BM today.   OB History    Gravida Para Term Preterm AB TAB SAB Ectopic Multiple Living   Past Medical History  Diagnosis Date  . Chlamydia   . Abnormal Pap smear   . Kidney stones   . Headache(784.0)     migraines  . Gonorrhea in pregnancy     Past Surgical History  Procedure Laterality Date  . Colposcopy    . Leep    . Dilation and evacuation  05/07/2011    Procedure: DILATATION AND EVACUATION (D&E);  Surgeon: Scheryl Darter, MD;  Location: [facility];  Service: Gynecology;  Laterality: N/A;  . Dilation and curettage of uterus N/A 09/28/2013    Procedure: DILATATION AND EVACUATION FOR RETAINED PLACENTA;  Surgeon: Tereso Newcomer, MD;  Location: [facility];  Service: Gynecology;  Laterality: N/A;    Family History  Problem Relation Age of Onset  . Cancer Maternal Grandmother   . Depression Maternal Grandmother   . Cancer Maternal Grandfather   . Depression Maternal Grandfather   . Cancer Paternal Grandmother   . Depression Paternal Grandmother   . Cancer Paternal Grandfather   . Depression Paternal Grandfather     History  Substance Use Topics  . Smoking status: Former Smoker -- 0.25 packs/day    Types: Cigarettes    Quit date: 03/26/2012  . Smokeless tobacco: Never Used  . Alcohol Use: No     Allergies: No Known Allergies  No prescriptions prior to admission    Review of Systems  Constitutional: Negative for fever and malaise/fatigue.  Gastrointestinal: Positive for nausea and abdominal pain. Negative for vomiting, diarrhea and constipation.  Genitourinary:       Neg - vaginal bleeding, discharge, LOF   Physical Exam   Blood pressure 113/74, pulse 97, temperature 98 F (36.7 C), resp. rate 16, height  (1.6 m), weight 202 lb (91.627 kg), last menstrual period 03/01/2014, not currently breastfeeding.  Physical Exam  Constitutional: She is oriented to person, place, and time. She appears well-developed and well-nourished. No distress.  HENT:  Head: Normocephalic.  Cardiovascular: Normal rate.   Respiratory: Effort normal.  GI: Soft. She exhibits no distension. There is no tenderness.  Neurological: She is alert and oriented to person, place, and time.  Skin: Skin is warm and dry. No erythema.  Psychiatric: She has a normal mood and affect.  Dilation: 3 Effacement (%): Thick Cervical Position: Posterior Station: -3 Exam by:: Harlon Flor PA  Results for orders placed or performed during the hospital encounter of 11/01/14 (from the past 24 hour(s))  Urinalysis, Routine w reflex microscopic     Status: Abnormal   Collection Time:  11/01/14 10:46 PM  Result Value Ref Range   Color, Urine YELLOW YELLOW   APPearance CLEAR CLEAR   Specific Gravity, Urine >1.030 (H) 1.005 - 1.030   pH 6.0 5.0 - 8.0   Glucose, UA NEGATIVE NEGATIVE mg/dL   Hgb urine dipstick NEGATIVE NEGATIVE   Bilirubin Urine NEGATIVE NEGATIVE   Ketones, ur NEGATIVE NEGATIVE mg/dL   Protein, ur NEGATIVE NEGATIVE mg/dL   Urobilinogen, UA 0.2 0.0 - 1.0 mg/dL   Nitrite NEGATIVE NEGATIVE   Leukocytes, UA NEGATIVE NEGATIVE    Fetal Monitoring:  Baseline: 135 bpm, moderate variability, + accelerations, no decelerations Contractions: q 15 minutes   MAU Course  Procedures None  MDM Cervix  unchanged from last visit at first check at 2315, few contractions noted on EFM, NST reactive. Will given Phenergan for nausea and Percocet for pain and continue to monitor. Recheck in 1 hour.  Cervix unchanged after > 1 hour Contractions noted q 15 minutes at time of discharge, mild to palpation. Patient reports improvement in pain.   Assessment and Plan  A: SIUP at 5674w1d H/O Preterm delivery x 2 Preterm contractions  P: Discharge home Preterm labor precautions discussed Advised rest and increased hydration Follow-up with Dr. Gaynell FaceMarshall as scheduled for routine prenatal care Patient may return to MAU as needed or if her condition were to change or worsen   Marny LowensteinJulie N Adreona Brand, PA-C  11/02/2014, 1:10 AM

## 2014-11-01 NOTE — MAU Note (Signed)
Pt  States she has been having contractions for the past 3 days, they have been pretty strong and are lasting longer

## 2014-11-02 DIAGNOSIS — O4703 False labor before 37 completed weeks of gestation, third trimester: Secondary | ICD-10-CM

## 2014-11-02 DIAGNOSIS — Z3A35 35 weeks gestation of pregnancy: Secondary | ICD-10-CM

## 2014-11-02 LAB — OB RESULTS CONSOLE GBS: STREP GROUP B AG: NEGATIVE

## 2014-11-02 LAB — OB RESULTS CONSOLE GC/CHLAMYDIA
Chlamydia: NEGATIVE
GC PROBE AMP, GENITAL: NEGATIVE

## 2014-11-02 NOTE — Discharge Instructions (Signed)
Braxton Hicks Contractions °Contractions of the uterus can occur throughout pregnancy. Contractions are not always a sign that you are in labor.  °WHAT ARE BRAXTON HICKS CONTRACTIONS?  °Contractions that occur before labor are called Braxton Hicks contractions, or false labor. Toward the end of pregnancy (32-34 weeks), these contractions can develop more often and may become more forceful. This is not true labor because these contractions do not result in opening (dilatation) and thinning of the cervix. They are sometimes difficult to tell apart from true labor because these contractions can be forceful and people have different pain tolerances. You should not feel embarrassed if you go to the hospital with false labor. Sometimes, the only way to tell if you are in true labor is for your health care provider to look for changes in the cervix. °If there are no prenatal problems or other health problems associated with the pregnancy, it is completely safe to be sent home with false labor and await the onset of true labor. °HOW CAN YOU TELL THE DIFFERENCE BETWEEN TRUE AND FALSE LABOR? °False Labor °· The contractions of false labor are usually shorter and not as hard as those of true labor.   °· The contractions are usually irregular.   °· The contractions are often felt in the front of the lower abdomen and in the groin.   °· The contractions may go away when you walk around or change positions while lying down.   °· The contractions get weaker and are shorter lasting as time goes on.   °· The contractions do not usually become progressively stronger, regular, and closer together as with true labor.   °True Labor °· Contractions in true labor last 30-70 seconds, become very regular, usually become more intense, and increase in frequency.   °· The contractions do not go away with walking.   °· The discomfort is usually felt in the top of the uterus and spreads to the lower abdomen and low back.   °· True labor can be  determined by your health care provider with an exam. This will show that the cervix is dilating and getting thinner.   °WHAT TO REMEMBER °· Keep up with your usual exercises and follow other instructions given by your health care provider.   °· Take medicines as directed by your health care provider.   °· Keep your regular prenatal appointments.   °· Eat and drink lightly if you think you are going into labor.   °· If Braxton Hicks contractions are making you uncomfortable:   °¨ Change your position from lying down or resting to walking, or from walking to resting.   °¨ Sit and rest in a tub of warm water.   °¨ Drink 2-3 glasses of water. Dehydration may cause these contractions.   °¨ Do slow and deep breathing several times an hour.   °WHEN SHOULD I SEEK IMMEDIATE MEDICAL CARE? °Seek immediate medical care if: °· Your contractions become stronger, more regular, and closer together.   °· You have fluid leaking or gushing from your vagina.   °· You have a fever.   °· You pass blood-tinged mucus.   °· You have vaginal bleeding.   °· You have continuous abdominal pain.   °· You have low back pain that you never had before.   °· You feel your baby's head pushing down and causing pelvic pressure.   °· Your baby is not moving as much as it used to.   °Document Released: 09/23/2005 Document Revised: 09/28/2013 Document Reviewed: 07/05/2013 °ExitCare® Patient Information ©2015 [employer]. This information is not intended to replace advice given to you by your health care   provider. Make sure you discuss any questions you have with your health care provider. ° °

## 2014-11-16 ENCOUNTER — Inpatient Hospital Stay (HOSPITAL_COMMUNITY)
Admission: AD | Admit: 2014-11-16 | Discharge: 2014-11-16 | Disposition: A | Discharge status: Home / Self Care | Payer: Medicaid Other | Source: Ambulatory Visit | Attending: Obstetrics | Admitting: Obstetrics

## 2014-11-16 ENCOUNTER — Encounter (HOSPITAL_COMMUNITY): Payer: Self-pay

## 2014-11-16 DIAGNOSIS — O9989 Other specified diseases and conditions complicating pregnancy, childbirth and the puerperium: Secondary | ICD-10-CM | POA: Insufficient documentation

## 2014-11-16 LAB — POCT FERN TEST: POCT Fern Test: NEGATIVE

## 2014-11-16 LAB — AMNISURE RUPTURE OF MEMBRANE (ROM) NOT AT ARMC: Amnisure ROM: NEGATIVE

## 2014-11-16 NOTE — MAU Note (Signed)
States that she is ready to be discharged and is not waiting for her friends anymore; states that she is going to drop FOB off at his friends and "she is not going to be with him or put up with him anymore"

## 2014-11-16 NOTE — Discharge Instructions (Signed)

## 2014-11-16 NOTE — MAU Note (Signed)
Crying;states that her and FOB have been "scuffling"; does not want police called; states that she has friends coming to take FOB home and she and the 511 yr old daughter are going to leave in pt's car; refused social work consult;

## 2014-11-16 NOTE — MAU Note (Signed)
Pt presents with c/o ROM at 0545 this morning. Denies bright red bleeding. States positive fetal movement. Irreg contractions. No r/f for preg.

## 2014-12-02 ENCOUNTER — Telehealth (HOSPITAL_COMMUNITY): Payer: Self-pay | Admitting: *Deleted

## 2014-12-02 ENCOUNTER — Encounter (HOSPITAL_COMMUNITY): Payer: Self-pay | Admitting: *Deleted

## 2014-12-02 NOTE — Telephone Encounter (Signed)
Preadmission screen  

## 2014-12-03 ENCOUNTER — Encounter (HOSPITAL_COMMUNITY): Payer: Self-pay | Admitting: *Deleted

## 2014-12-03 ENCOUNTER — Inpatient Hospital Stay (HOSPITAL_COMMUNITY)
Admission: AD | Admit: 2014-12-03 | Discharge: 2014-12-05 | DRG: 775 | Disposition: A | Discharge status: Home / Self Care | Payer: Medicaid Other | Source: Ambulatory Visit | Attending: Obstetrics | Admitting: Obstetrics

## 2014-12-03 DIAGNOSIS — Z833 Family history of diabetes mellitus: Secondary | ICD-10-CM

## 2014-12-03 DIAGNOSIS — O471 False labor at or after 37 completed weeks of gestation: Secondary | ICD-10-CM | POA: Diagnosis present

## 2014-12-03 DIAGNOSIS — Z87891 Personal history of nicotine dependence: Secondary | ICD-10-CM | POA: Diagnosis not present

## 2014-12-03 DIAGNOSIS — IMO0001 Reserved for inherently not codable concepts without codable children: Secondary | ICD-10-CM

## 2014-12-03 DIAGNOSIS — Z3A39 39 weeks gestation of pregnancy: Secondary | ICD-10-CM | POA: Diagnosis present

## 2014-12-03 DIAGNOSIS — Z87442 Personal history of urinary calculi: Secondary | ICD-10-CM

## 2014-12-03 LAB — CBC
HEMATOCRIT: 38 % (ref 36.0–46.0)
Hemoglobin: 12.5 g/dL (ref 12.0–15.0)
MCH: 30.5 pg (ref 26.0–34.0)
MCHC: 32.9 g/dL (ref 30.0–36.0)
MCV: 92.7 fL (ref 78.0–100.0)
Platelets: 211 10*3/uL (ref 150–400)
RBC: 4.1 MIL/uL (ref 3.87–5.11)
RDW: 14.9 % (ref 11.5–15.5)
WBC: 13.3 10*3/uL — AB (ref 4.0–10.5)

## 2014-12-03 LAB — TYPE AND SCREEN
ABO/RH(D): O POS
Antibody Screen: NEGATIVE

## 2014-12-03 MED ORDER — METHYLERGONOVINE MALEATE 0.2 MG/ML IJ SOLN
INTRAMUSCULAR | Status: DC
Start: 2014-12-03 — End: 2014-12-03
  Filled 2014-12-03: qty 1

## 2014-12-03 MED ORDER — FLEET ENEMA 7-19 GM/118ML RE ENEM: 1.0000 | ENEMA | RECTAL | Status: DC | PRN

## 2014-12-03 MED ORDER — LIDOCAINE HCL (PF) 1 % IJ SOLN
30.0000 mL | INTRAMUSCULAR | Status: DC | PRN
Start: 2014-12-03 — End: 2014-12-03

## 2014-12-03 MED ORDER — OXYTOCIN 40 UNITS IN LACTATED RINGERS INFUSION - SIMPLE MED: 62.5000 mL/h | INTRAVENOUS | Status: DC

## 2014-12-03 MED ORDER — SODIUM CHLORIDE 0.9 % IV SOLN: Freq: Once | INTRAVENOUS | Status: DC

## 2014-12-03 MED ORDER — LIDOCAINE HCL (PF) 1 % IJ SOLN
INTRAMUSCULAR | Status: AC
  Filled 2014-12-03: qty 30

## 2014-12-03 MED ORDER — MISOPROSTOL 200 MCG PO TABS
ORAL_TABLET | ORAL | Status: AC
  Filled 2014-12-03: qty 4

## 2014-12-03 MED ORDER — METHYLERGONOVINE MALEATE 0.2 MG/ML IJ SOLN: 0.2000 mg | Freq: Four times a day (QID) | INTRAMUSCULAR | Status: DC

## 2014-12-03 MED ORDER — METHYLERGONOVINE MALEATE 0.2 MG PO TABS
0.2000 mg | ORAL_TABLET | Freq: Once | ORAL | Status: DC
  Administered 2014-12-03: 0.2 mg via ORAL
  Filled 2014-12-03: qty 1

## 2014-12-03 MED ORDER — DIPHENOXYLATE-ATROPINE 2.5-0.025 MG PO TABS: 2.0000 | ORAL_TABLET | Freq: Once | ORAL | Status: DC

## 2014-12-03 MED ORDER — PRENATAL MULTIVITAMIN CH
1.0000 | ORAL_TABLET | Freq: Every day | ORAL | Status: DC
  Administered 2014-12-03 – 2014-12-05 (×3): 1 via ORAL
  Filled 2014-12-03 (×3): qty 1

## 2014-12-03 MED ORDER — CARBOPROST TROMETHAMINE 250 MCG/ML IM SOLN: 250.0000 ug | Freq: Once | INTRAMUSCULAR | Status: DC

## 2014-12-03 MED ORDER — METHYLERGONOVINE MALEATE 0.2 MG PO TABS
0.2000 mg | ORAL_TABLET | Freq: Four times a day (QID) | ORAL | Status: DC
  Administered 2014-12-03 – 2014-12-04 (×3): 0.2 mg via ORAL
  Filled 2014-12-03 (×3): qty 1

## 2014-12-03 MED ORDER — LANOLIN HYDROUS EX OINT: TOPICAL_OINTMENT | CUTANEOUS | Status: DC | PRN

## 2014-12-03 MED ORDER — SENNOSIDES-DOCUSATE SODIUM 8.6-50 MG PO TABS
2.0000 | ORAL_TABLET | ORAL | Status: DC
  Administered 2014-12-04 – 2014-12-05 (×2): 2 via ORAL
  Filled 2014-12-03 (×3): qty 2

## 2014-12-03 MED ORDER — BUTORPHANOL TARTRATE 1 MG/ML IJ SOLN
INTRAMUSCULAR | Status: AC
  Filled 2014-12-03: qty 1

## 2014-12-03 MED ORDER — OXYCODONE-ACETAMINOPHEN 5-325 MG PO TABS
2.0000 | ORAL_TABLET | ORAL | Status: DC | PRN
  Administered 2014-12-03 – 2014-12-04 (×4): 2 via ORAL
  Filled 2014-12-03 (×4): qty 2

## 2014-12-03 MED ORDER — OXYCODONE-ACETAMINOPHEN 5-325 MG PO TABS
1.0000 | ORAL_TABLET | ORAL | Status: DC | PRN
Start: 2014-12-03 — End: 2014-12-03
  Filled 2014-12-03: qty 1

## 2014-12-03 MED ORDER — ONDANSETRON HCL 4 MG PO TABS: 4.0000 mg | ORAL_TABLET | ORAL | Status: DC | PRN

## 2014-12-03 MED ORDER — TETANUS-DIPHTH-ACELL PERTUSSIS 5-2.5-18.5 LF-MCG/0.5 IM SUSP
0.5000 mL | Freq: Once | INTRAMUSCULAR | Status: AC
  Administered 2014-12-04: 0.5 mL via INTRAMUSCULAR
  Filled 2014-12-03: qty 0.5

## 2014-12-03 MED ORDER — WITCH HAZEL-GLYCERIN EX PADS: 1.0000 "application " | MEDICATED_PAD | CUTANEOUS | Status: DC | PRN

## 2014-12-03 MED ORDER — OXYTOCIN BOLUS FROM INFUSION
500.0000 mL | INTRAVENOUS | Status: DC
  Administered 2014-12-03: 500 mL via INTRAVENOUS

## 2014-12-03 MED ORDER — CITRIC ACID-SODIUM CITRATE 334-500 MG/5ML PO SOLN: 30.0000 mL | ORAL | Status: DC | PRN

## 2014-12-03 MED ORDER — OXYTOCIN 40 UNITS IN LACTATED RINGERS INFUSION - SIMPLE MED
62.5000 mL/h | INTRAVENOUS | Status: AC
  Filled 2014-12-03: qty 1000

## 2014-12-03 MED ORDER — ACETAMINOPHEN 325 MG PO TABS
650.0000 mg | ORAL_TABLET | ORAL | Status: DC | PRN
  Filled 2014-12-03: qty 2

## 2014-12-03 MED ORDER — ONDANSETRON HCL 4 MG/2ML IJ SOLN
4.0000 mg | INTRAMUSCULAR | Status: DC | PRN
  Administered 2014-12-03 – 2014-12-04 (×2): 4 mg via INTRAVENOUS
  Filled 2014-12-03 (×2): qty 2

## 2014-12-03 MED ORDER — IBUPROFEN 600 MG PO TABS
600.0000 mg | ORAL_TABLET | Freq: Four times a day (QID) | ORAL | Status: DC
  Administered 2014-12-03 – 2014-12-05 (×11): 600 mg via ORAL
  Filled 2014-12-03 (×11): qty 1

## 2014-12-03 MED ORDER — INFLUENZA VAC SPLIT QUAD 0.5 ML IM SUSY
0.5000 mL | PREFILLED_SYRINGE | INTRAMUSCULAR | Status: AC
  Administered 2014-12-04: 0.5 mL via INTRAMUSCULAR
  Filled 2014-12-03: qty 0.5

## 2014-12-03 MED ORDER — METHYLERGONOVINE MALEATE 0.2 MG/ML IJ SOLN
INTRAMUSCULAR | Status: AC
  Filled 2014-12-03: qty 1

## 2014-12-03 MED ORDER — DIBUCAINE 1 % RE OINT
1.0000 "application " | TOPICAL_OINTMENT | RECTAL | Status: DC | PRN
  Filled 2014-12-03: qty 28

## 2014-12-03 MED ORDER — IBUPROFEN 600 MG PO TABS
600.0000 mg | ORAL_TABLET | Freq: Four times a day (QID) | ORAL | Status: DC | PRN
  Filled 2014-12-03: qty 1

## 2014-12-03 MED ORDER — ZOLPIDEM TARTRATE 5 MG PO TABS: 5.0000 mg | ORAL_TABLET | Freq: Every evening | ORAL | Status: DC | PRN

## 2014-12-03 MED ORDER — LACTATED RINGERS IV SOLN: INTRAVENOUS | Status: DC

## 2014-12-03 MED ORDER — OXYCODONE-ACETAMINOPHEN 5-325 MG PO TABS
2.0000 | ORAL_TABLET | ORAL | Status: DC | PRN
Start: 2014-12-03 — End: 2014-12-03

## 2014-12-03 MED ORDER — PNEUMOCOCCAL VAC POLYVALENT 25 MCG/0.5ML IJ INJ
0.5000 mL | INJECTION | INTRAMUSCULAR | Status: DC
  Filled 2014-12-03: qty 0.5

## 2014-12-03 MED ORDER — BENZOCAINE-MENTHOL 20-0.5 % EX AERO
1.0000 "application " | INHALATION_SPRAY | CUTANEOUS | Status: DC | PRN
  Administered 2014-12-03: 1 via TOPICAL
  Filled 2014-12-03 (×2): qty 56

## 2014-12-03 MED ORDER — ONDANSETRON HCL 4 MG/2ML IJ SOLN: 4.0000 mg | Freq: Four times a day (QID) | INTRAMUSCULAR | Status: DC | PRN

## 2014-12-03 MED ORDER — DIPHENHYDRAMINE HCL 25 MG PO CAPS: 25.0000 mg | ORAL_CAPSULE | Freq: Four times a day (QID) | ORAL | Status: DC | PRN

## 2014-12-03 MED ORDER — OXYCODONE-ACETAMINOPHEN 5-325 MG PO TABS
1.0000 | ORAL_TABLET | ORAL | Status: DC | PRN
  Administered 2014-12-03 – 2014-12-05 (×6): 1 via ORAL
  Filled 2014-12-03 (×5): qty 1

## 2014-12-03 MED ORDER — SIMETHICONE 80 MG PO CHEW: 80.0000 mg | CHEWABLE_TABLET | ORAL | Status: DC | PRN

## 2014-12-03 MED ORDER — LACTATED RINGERS IV SOLN: 500.0000 mL | INTRAVENOUS | Status: DC | PRN

## 2014-12-03 MED ORDER — OXYTOCIN 40 UNITS IN LACTATED RINGERS INFUSION - SIMPLE MED
INTRAVENOUS | Status: AC
  Filled 2014-12-03: qty 1000

## 2014-12-03 MED ORDER — BUTORPHANOL TARTRATE 1 MG/ML IJ SOLN
1.0000 mg | INTRAMUSCULAR | Status: DC | PRN
  Administered 2014-12-03: 1 mg via INTRAVENOUS

## 2014-12-03 NOTE — Lactation Note (Signed)
This note was copied from the chart of [name] Delana MeyerMarsha Revelle. Lactation Consultation Note  Patient Name: [name] [name] FAOZH'YToday's Date: 12/03/2014 Reason for consult: Initial assessment Baby 10 hours of life. Mom reports some difficulty with getting baby latched to left breast. Mom has large pendulous breast. Assisted mom to latch baby in football position to left breast. Baby sleepy and would not wake to latch. Discussed normal newborn behavior with mom. Reviewed positioning and breast compression. Mom has colostrum present. Mom asking about baby self-latching. Discussed positioning and need to support her breast for baby. Enc mom to offer lots of STS and nurse with cues. Enc mom to call out for assistance as needed. [facility] given [facility], aware of OP/BFSG, community resources, and [facility] phone [facility] assistance after [facility]/C.   Maternal Data Has patient been taught Hand Expression?: Yes Does the patient have breastfeeding experience prior to this delivery?: Yes  Feeding Feeding Type: Breast Fed Length of feed: 0 min  LATCH Score/Interventions Latch: Too sleepy or reluctant, no latch achieved, no sucking elicited. Intervention(s): Skin to skin;Teach feeding cues;Waking techniques Intervention(s): Adjust position;Assist with latch;Breast compression  Audible Swallowing: None  Type of Nipple: Everted at rest and after stimulation  Comfort (Breast/Nipple): Soft / non-tender     Hold (Positioning): Assistance needed to correctly position infant at breast and maintain latch.  LATCH Score: 5  Lactation Tools Discussed/Used     Consult Status Consult Status: Follow-up Date: 12/04/14 Follow-up type: In-patient    [name] [name], [name] [name] 12/03/2014, 6:12 PM

## 2014-12-03 NOTE — Progress Notes (Signed)
Dr Clearance CootsHarper notified of pt's admission and status. Aware of sve and pt going to BS. MD will meet pt in Bs.

## 2014-12-03 NOTE — MAU Note (Signed)
[facility]. Very uncomfortable with ctxs. States is 7th baby and other del svd. Did have PTL with this preg. SVe pt is 6-7/90/-1. Intact. [facility]. Dr Clearance CootsHarper notified and will see pt in Bs.

## 2014-12-03 NOTE — Progress Notes (Signed)
[facility]

## 2014-12-03 NOTE — Progress Notes (Signed)
Clinical Social Work Department PSYCHOSOCIAL ASSESSMENT - MATERNAL/CHILD 12/03/2014  Patient:  [name] [name], [name] [name]  Account Number:  [id number]  [place] Date:  12/03/2014  Ardine Eng Name:   Cameron Ali    Clinical Social Worker:  Culver Feighner, LCSW   Date/Time:  12/03/2014 01:30 PM  Date Referred:  12/03/2014   Referral source  Central Nursery     Referred reason  Depression/Anxiety   Other referral source:    I:  FAMILY / [place] legal guardian:  PARENT  Guardian - Name Guardian - Age Guardian - Address  [name] [name],[name] [name] [address] [place], [place] [address]  [place], [place] 52    Other household support members/support persons Other support:   grandparents    II  PSYCHOSOCIAL DATA Information Source:    Insurance risk surveyor Resources Employment:   FOB is employed   Museum/gallery curator resources:  [employer] If [place] / Grade:   Maternity Care Coordinator / Child Services Coordination / Early Interventions:  Cultural issues impacting care:    III  STRENGTHS Strengths  Supportive family/friends  Home prepared for Child (including basic supplies)  Adequate Resources   Strength comment:    IV  RISK FACTORS AND CURRENT PROBLEMS Current Problem:     Risk Factor & Current Problem Patient Issue Family Issue Risk Factor / Current Problem Comment   N N     V  SOCIAL WORK ASSESSMENT Acknowledged order for social work consult to assess mother's hx of depression.  Met with mother who was very pleasant and cooperative.   Mother states that she had a fetal demise on 09/28/2013 and was dx with depression, anxiety and PTSD after this loss.    Informed that she was prescribed Lexapro but didn't take the medication because of the pregnancy.   Mother has 6 other children.  Her 59 year old lives with maternal grandmother, the 58,31, and 60 year old lives with paternal grandmother, her 46 year old was adopted by a relative, and the 34 year old lives  with her.  Informed that several years ago she and father of the 48,37, and 67 year old were sharing a home, and he was arrested and sentenced to a couple of years in jail. 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 Informed that she eventually lost her home and DSS was called. Informed that she agreed to allow her children to stay with maternal and paternal grandparents who lives about 5 minutes apart.  MOB states that the case was eventually closed but the children remained in relative care because she did not want to disrupt their school schedule.  Informed that she plans to move closer to her family in a couple of months so it would not be so disruptive when the children starts living with her again. She and the 34 year old are living together.  Informed that FOB is employed as a Dealer and financially supporting her.  FOB is also the father of the 74 year old.  She denies any use of alcohol or illicit drug use during pregnancy. Admits to use of marijuana about 6 years ago, and denies hx of any other illicit drug use.  She is aware of signs/symptoms and resources for PP Depression.  Good interaction with newborn noted.  Informed that she is well prepared for newborn.  Mother informed of CSW availability.  VI SOCIAL WORK PLAN Social Work Plan  No Further Intervention Required / No Barriers to Discharge   Type of pt/family education:  PP Depression information and resources If child protective services report - county:   If child protective services report - date:   Information/referral to community resources comment:   Mother states she's active with a  Music therapist in [facility]   Other social work plan:

## 2014-12-03 NOTE — H&P (Signed)
[name] MeyerMarsha [name] is a 34 y.o. female presenting for UC's. Maternal Medical History:  Reason for admission: Contractions.   Contractions: Frequency: regular.   Perceived severity is strong.    Fetal activity: Perceived fetal activity is normal.   Last perceived fetal movement was within the past hour.    Prenatal complications: no prenatal complications Prenatal Complications - Diabetes: none.    OB History    Gravida Para Term Preterm AB TAB SAB Ectopic Multiple Living   10 6 4 2 3  3   6      Past Medical History  Diagnosis Date  . Chlamydia   . Abnormal Pap smear   . Kidney stones   . Headache(784.0)     migraines  . Gonorrhea in pregnancy   . Depression     pp no treatment, 2 years ago  . Vaginal Pap smear, abnormal    Past Surgical History  Procedure Laterality Date  . Colposcopy    . Leep    . Dilation and evacuation  05/07/2011    Procedure: DILATATION AND EVACUATION (D&E);  Surgeon: Scheryl DarterJames Arnold, MD;  Location: [facility];  Service: Gynecology;  Laterality: N/A;  . Dilation and curettage of uterus N/A 09/28/2013    Procedure: DILATATION AND EVACUATION FOR RETAINED PLACENTA;  Surgeon: Tereso NewcomerUgonna A Anyanwu, MD;  Location: [facility];  Service: Gynecology;  Laterality: N/A;   Family History: family history includes Cancer in her maternal grandfather, maternal grandmother, paternal grandfather, and paternal grandmother; Depression in her maternal grandfather, maternal grandmother, paternal grandfather, and paternal grandmother; Diabetes in her paternal grandmother and paternal uncle. Social History:  reports that she quit smoking about 2 years ago. Her smoking use included Cigarettes. She smoked 0.25 packs per day. She has never used smokeless tobacco. She reports that she does not drink alcohol or use illicit drugs.   Prenatal Transfer Tool  Maternal Diabetes: No Genetic Screening: Normal Maternal Ultrasounds/Referrals: Normal Fetal Ultrasounds or other Referrals:  None Maternal  Substance Abuse:  No Significant Maternal Medications:  None Significant Maternal Lab Results:  None Other Comments:  None  Review of Systems  All other systems reviewed and are negative.   Dilation: 8 Effacement (%): 100 Station: -1 Exam by:: C. Blackstock, RN Blood pressure 138/80, pulse 82, temperature 97.8 F (36.6 C), temperature source Oral, height 5\' 3"  (1.6 m), weight 210 lb (95.255 kg), last menstrual period 03/01/2014, not currently breastfeeding. Maternal Exam:  Abdomen: Patient reports no abdominal tenderness. Fetal presentation: vertex  Pelvis: adequate for delivery.   Cervix: Cervix evaluated by digital exam.     Physical Exam  Nursing note and vitals reviewed. Constitutional: She is oriented to person, place, and time. She appears well-developed and well-nourished.  HENT:  Head: Normocephalic and atraumatic.  Eyes: Conjunctivae are normal. Pupils are equal, round, and reactive to light.  Neck: Normal range of motion. Neck supple.  Cardiovascular: Normal rate and regular rhythm.   Respiratory: Effort normal and breath sounds normal.  GI: Soft.  Musculoskeletal: Normal range of motion.  Neurological: She is alert and oriented to person, place, and time.  Skin: Skin is warm and dry.  Psychiatric: She has a normal mood and affect. Her behavior is normal. Judgment and thought content normal.    Prenatal labs: ABO, Rh: O/Positive/-- (12/02 0000) Antibody: Negative (12/02 0000) Rubella: Immune (12/02 0000) RPR: Nonreactive (12/02 0000)  HBsAg: Negative (12/02 0000)  HIV: Non-reactive (12/02 0000)  GBS: Negative (01/27 0000)   Assessment/Plan: 39 weeks.  Active  labor.  Admit.   HARPER,CHARLES A 12/03/2014, 7:26 AM

## 2014-12-04 LAB — CBC
HEMATOCRIT: 34 % — AB (ref 36.0–46.0)
HEMOGLOBIN: 11.3 g/dL — AB (ref 12.0–15.0)
MCH: 31 pg (ref 26.0–34.0)
MCHC: 33.2 g/dL (ref 30.0–36.0)
MCV: 93.2 fL (ref 78.0–100.0)
Platelets: 165 10*3/uL (ref 150–400)
RBC: 3.65 MIL/uL — AB (ref 3.87–5.11)
RDW: 14.9 % (ref 11.5–15.5)
WBC: 10.5 10*3/uL (ref 4.0–10.5)

## 2014-12-04 LAB — RPR: RPR Ser Ql: NONREACTIVE

## 2014-12-04 MED ORDER — PROMETHAZINE HCL 25 MG/ML IJ SOLN
25.0000 mg | Freq: Four times a day (QID) | INTRAMUSCULAR | Status: DC | PRN
  Administered 2014-12-04: 25 mg via INTRAVENOUS
  Filled 2014-12-04: qty 1

## 2014-12-04 NOTE — Progress Notes (Signed)
Post Partum Day 1 Subjective: up ad lib, voiding, tolerating PO and c/o severe nausea  Objective: Blood pressure 96/62, pulse 78, temperature 98 F (36.7 C), temperature source Oral, resp. rate 20, height 5\' 3"  (1.6 m), weight 210 lb (95.255 kg), last menstrual period 03/01/2014, SpO2 97 %, unknown if currently breastfeeding.  Physical Exam:  General: alert and no distress Lochia: appropriate Uterine Fundus: firm Incision: none DVT Evaluation: No evidence of DVT seen on physical exam.   Recent Labs  12/03/14 0705  HGB 12.5  HCT 38.0    Assessment/Plan: Plan for discharge tomorrow.  Phenergan Rx.   LOS: 1 day   [name] [name] A 12/04/2014, 8:07 AM

## 2014-12-05 ENCOUNTER — Inpatient Hospital Stay (HOSPITAL_COMMUNITY): Admission: RE | Admit: 2014-12-05 | Payer: Medicaid Other | Source: Ambulatory Visit

## 2014-12-05 NOTE — Progress Notes (Signed)
Patient ID: [name] MeyerMarsha [name], female   DOB: 1980-12-27, 34 y.o.   MRN: [id number] Postpartum day 2 Vital signs normal Fundus firm Lochia moderate Home today

## 2014-12-05 NOTE — Discharge Instructions (Signed)
Discharge instructions   You can wash your hair  Shower  Eat what you want  Drink what you want  See me in 6 weeks  Your ankles are going to swell more in the next 2 weeks than when pregnant  No sex for 6 weeks   Emiliano Welshans A, MD 12/05/2014

## 2014-12-05 NOTE — Discharge Summary (Signed)
Obstetric Discharge Summary Reason for Admission: onset of labor Prenatal Procedures: none Intrapartum Procedures: spontaneous vaginal delivery Postpartum Procedures: none Complications-Operative and Postpartum: none HEMOGLOBIN  Date Value Ref Range Status  12/04/2014 11.3* 12.0 - 15.0 g/dL Final   HCT  Date Value Ref Range Status  12/04/2014 34.0* 36.0 - 46.0 % Final    Physical Exam:  General: alert Lochia: appropriate Uterine Fundus: firm Incision: healing well DVT Evaluation: No evidence of DVT seen on physical exam.  Discharge Diagnoses: Term Pregnancy-delivered  Discharge Information: Date: 12/05/2014 Activity: pelvic rest Diet: routine Medications: Percocet Condition: stable Instructions: refer to practice specific booklet Discharge to: home Follow-up Information    Follow up with Kathreen CosierMARSHALL,Tanairi Cypert A, MD.   Specialty:  Obstetrics and Gynecology   Contact information:   [address] [place] [address] [phone]       Newborn Data: Live born female  Birth Weight: 8 lb 0.4 oz (3640 g) APGAR: 9, 9  Home with mother.  Stellar Gensel A 12/05/2014, 6:31 AM

## 2014-12-05 NOTE — Progress Notes (Signed)
CSW confirmed with [place] CPS intake worker, Pamela Miller, that there is no open CPS case.  Per Intake, [facility] has not had an open case since 2012.   No barriers to discharge.  

## 2014-12-05 NOTE — Lactation Note (Signed)
This note was copied from the chart of [name] [name] [place]. Lactation Consultation Note: mom bottle fed formula most of yesterday . Reports that she has put baby to the breast this morning and baby is latching well. Reports baby fed about 1 hour ago and she is asleep at mom's side at present  Reports breasts are feeling a little fuller this morning. Has DEBP in room- has larger flanges and mom reports they feel better. No questions at present. To call prn  Infant Name: [name] [name] Ledo LKGMW'NToday's Date: 12/05/2014 Reason for consult: Follow-up assessment   Maternal Data Formula Feeding for Exclusion: Yes Reason for exclusion: Mother's choice to formula and breast feed on admission Does the patient have breastfeeding experience prior to this delivery?: Yes  Feeding   LATCH Score/Interventions     Lactation Tools Discussed/Used     Consult Status Consult Status: Complete    Pamelia HoitWeeks, Jayvin Hurrell D 12/05/2014, 10:15 AM

## 2014-12-05 NOTE — Progress Notes (Signed)
UR chart review completed.  

## 2015-06-26 ENCOUNTER — Encounter (HOSPITAL_COMMUNITY): Payer: Self-pay

## 2015-06-26 ENCOUNTER — Emergency Department (HOSPITAL_COMMUNITY)
Admission: EM | Admit: 2015-06-26 | Discharge: 2015-06-26 | Disposition: A | Discharge status: Home / Self Care | Payer: Medicaid Other | Attending: Emergency Medicine | Admitting: Emergency Medicine

## 2015-06-26 DIAGNOSIS — Y998 Other external cause status: Secondary | ICD-10-CM | POA: Insufficient documentation

## 2015-06-26 DIAGNOSIS — S199XXA Unspecified injury of neck, initial encounter: Secondary | ICD-10-CM | POA: Insufficient documentation

## 2015-06-26 DIAGNOSIS — Y9389 Activity, other specified: Secondary | ICD-10-CM | POA: Diagnosis not present

## 2015-06-26 DIAGNOSIS — Z87442 Personal history of urinary calculi: Secondary | ICD-10-CM | POA: Diagnosis not present

## 2015-06-26 DIAGNOSIS — Z8619 Personal history of other infectious and parasitic diseases: Secondary | ICD-10-CM | POA: Insufficient documentation

## 2015-06-26 DIAGNOSIS — Z8659 Personal history of other mental and behavioral disorders: Secondary | ICD-10-CM | POA: Diagnosis not present

## 2015-06-26 DIAGNOSIS — Z87891 Personal history of nicotine dependence: Secondary | ICD-10-CM | POA: Diagnosis not present

## 2015-06-26 DIAGNOSIS — Y92410 Unspecified street and highway as the place of occurrence of the external cause: Secondary | ICD-10-CM | POA: Diagnosis not present

## 2015-06-26 DIAGNOSIS — S0990XA Unspecified injury of head, initial encounter: Secondary | ICD-10-CM | POA: Diagnosis not present

## 2015-06-26 DIAGNOSIS — M25512 Pain in left shoulder: Secondary | ICD-10-CM

## 2015-06-26 DIAGNOSIS — S4992XA Unspecified injury of left shoulder and upper arm, initial encounter: Secondary | ICD-10-CM | POA: Diagnosis present

## 2015-06-26 MED ORDER — METHOCARBAMOL 500 MG PO TABS: 500.0000 mg | ORAL_TABLET | Freq: Two times a day (BID) | ORAL | Status: AC | PRN

## 2015-06-26 MED ORDER — ACETAMINOPHEN 325 MG PO TABS
650.0000 mg | ORAL_TABLET | Freq: Once | ORAL | Status: AC
  Administered 2015-06-26: 650 mg via ORAL
  Filled 2015-06-26: qty 2

## 2015-06-26 MED ORDER — NAPROXEN 250 MG PO TABS: 250.0000 mg | ORAL_TABLET | Freq: Two times a day (BID) | ORAL | Status: AC
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
## 2015-06-26 NOTE — Progress Notes (Signed)
[place] medicaid pt without a medicaid dr assigned r/t medicaid plan Pt has not chosen a medicaid dr [employer] given a list of medicaid providers for [employer]  Pt encouraged to Please contact the Dept of Social services case worker to have a local medicaid accepting primary care doctor entered pror to going to one from list

## 2015-07-19 IMAGING — US US OB COMP LESS 14 WK
1 series · 14 of 28 positions shown · non-contrast
Comparison: None.

Ob ultrasound 05/07/2011

CLINICAL DATA: Rule out ectopic. Pelvic pain. Quantitative beta HCG
is [DATE].

EXAM:
OBSTETRIC <14 WK US AND TRANSVAGINAL OB US
TECHNIQUE: Both transabdominal and transvaginal ultrasound examinations were
performed for complete evaluation of the gestation as well as the
maternal uterus, adnexal regions, and pelvic cul-de-sac.
Transvaginal technique was performed to assess early pregnancy.

[Series 1: us ob comp less 14 wk · 0.22mm/px · 14 of 37 slices shown]
[im 2/37]
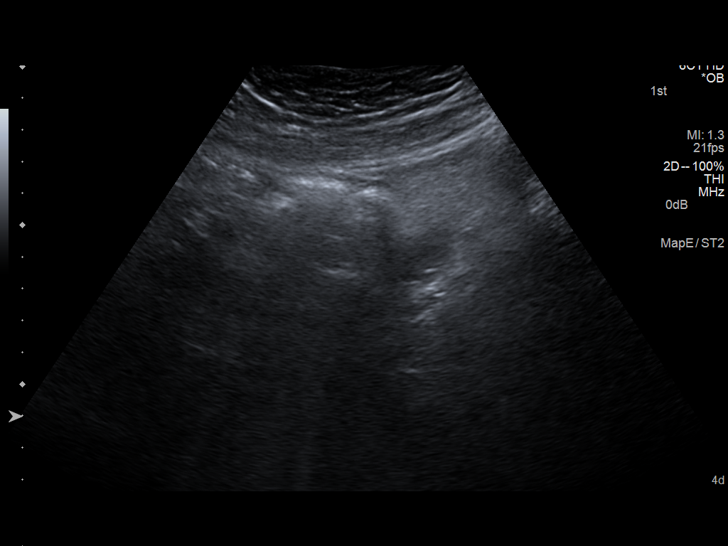
[im 5/37]
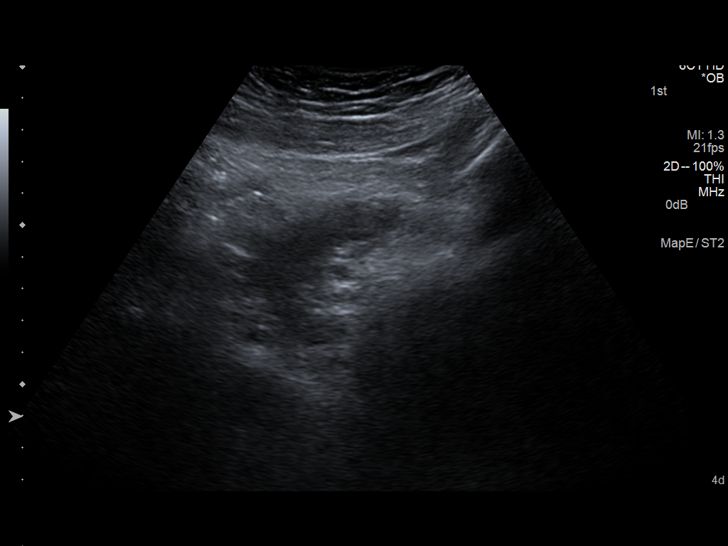
[im 7/37]
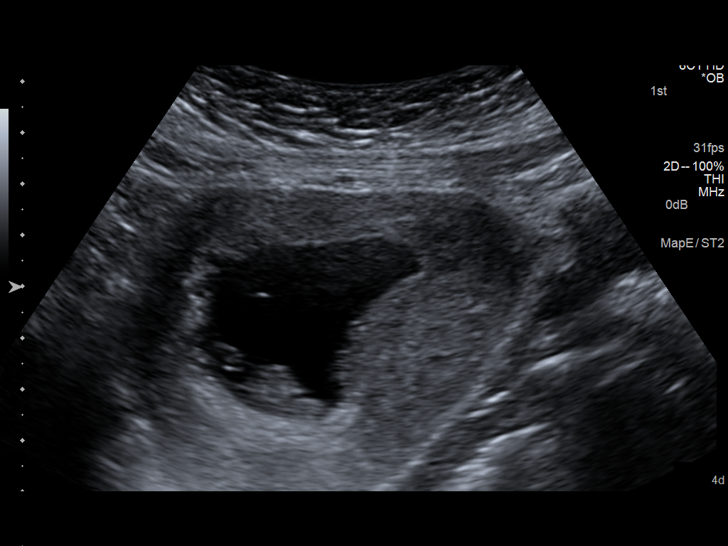
[im 10/37]
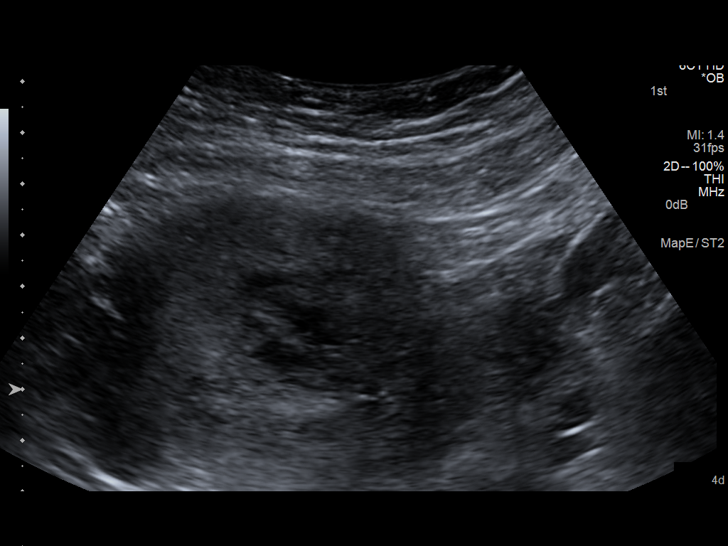
[im 13/37]
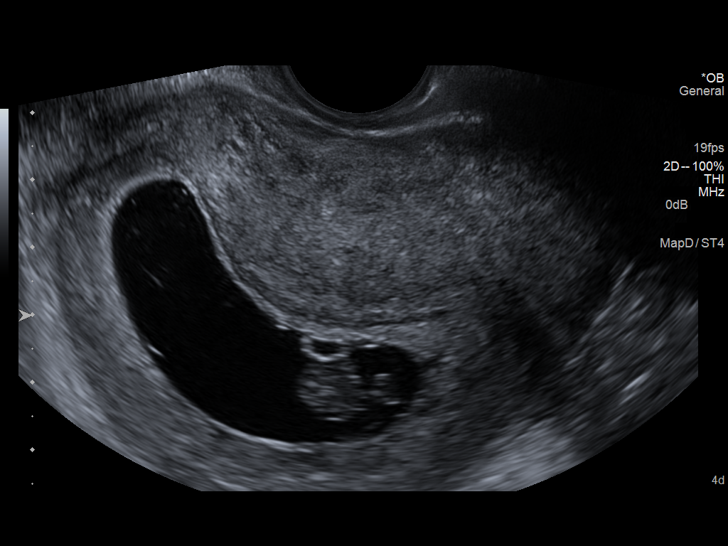
[im 15/37]
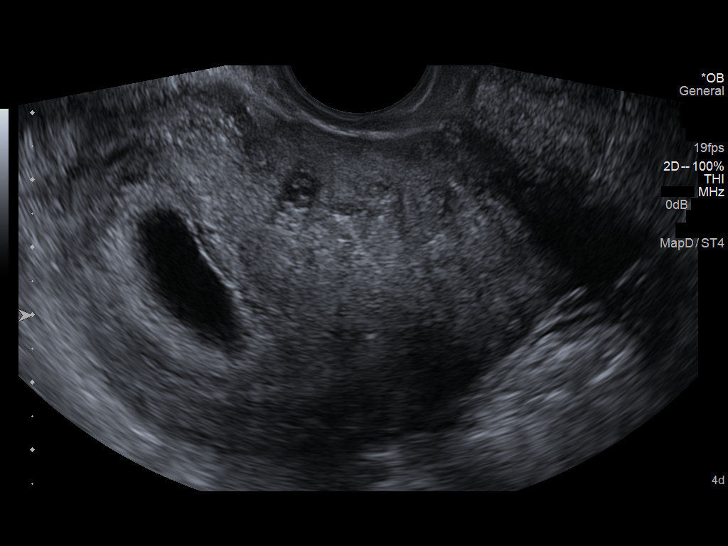
[im 18/37]
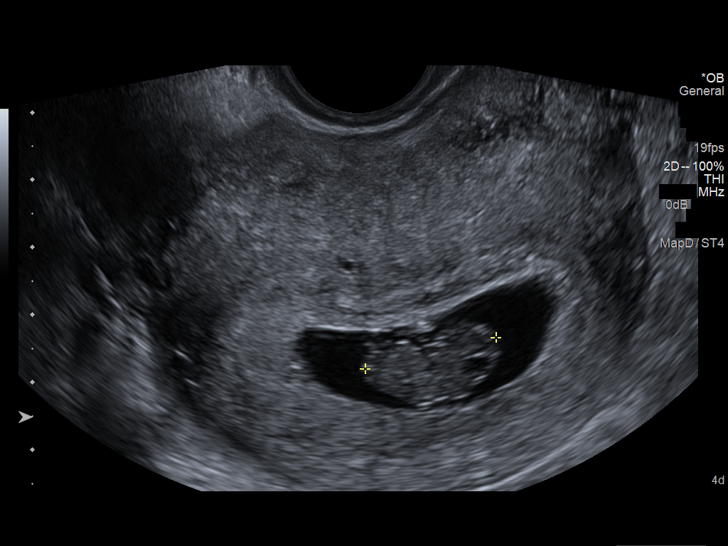
[im 21/37]
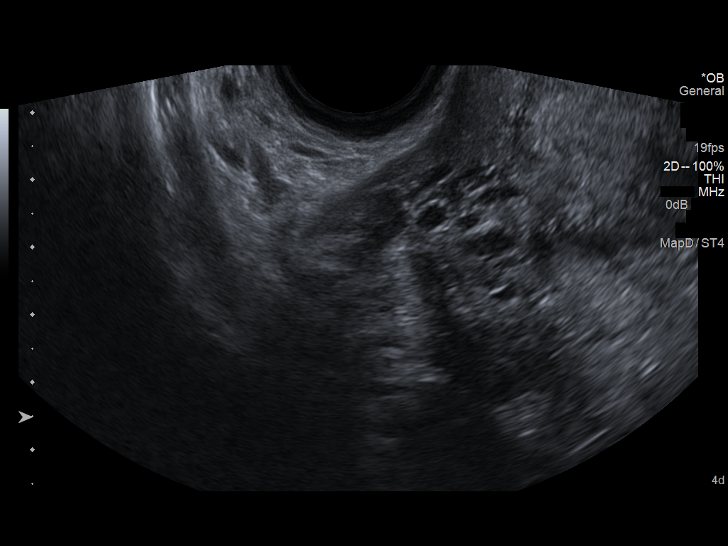
[im 23/37]
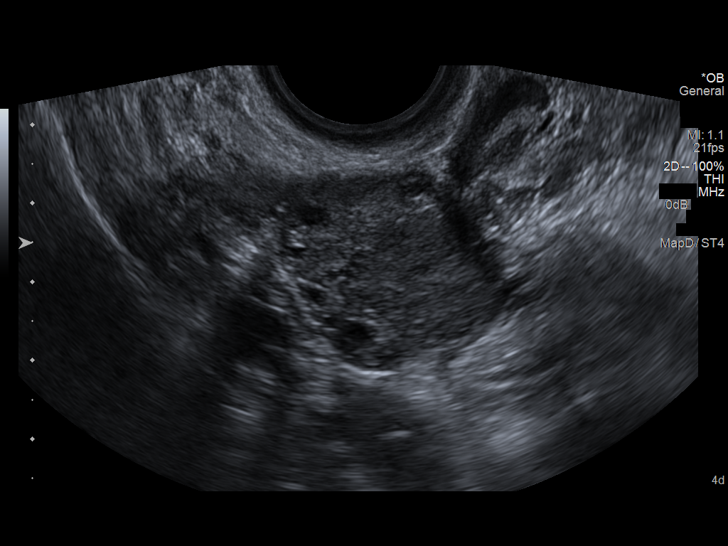
[im 26/37]
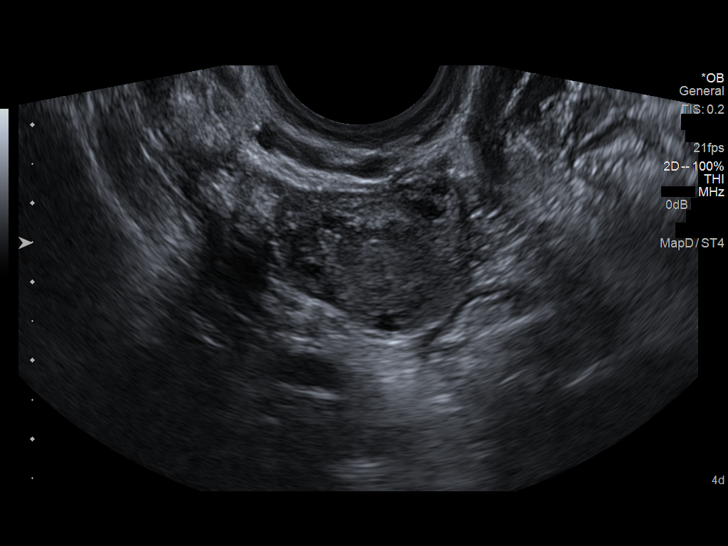
[im 29/37]
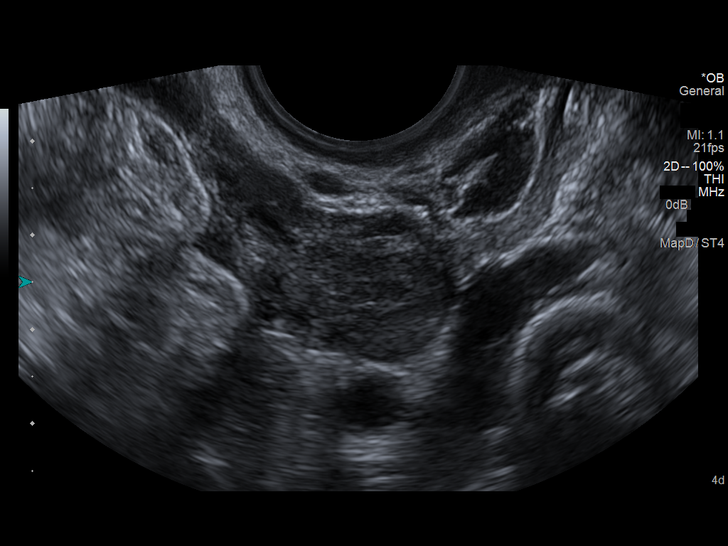
[im 31/37]
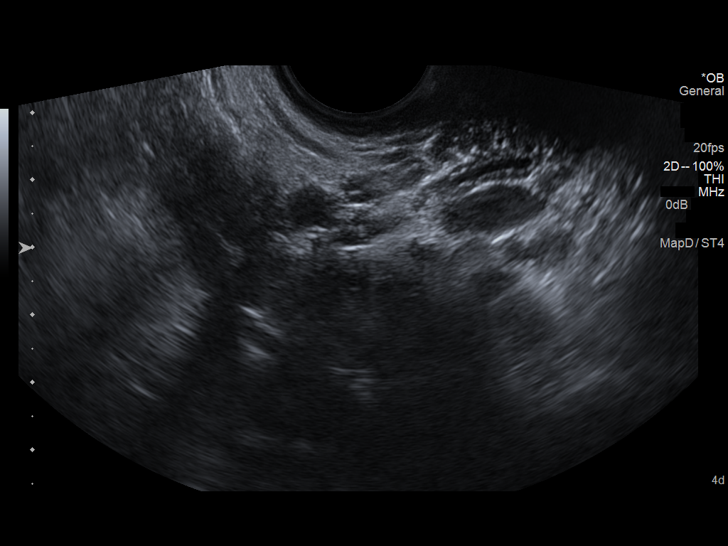
[im 34/37]
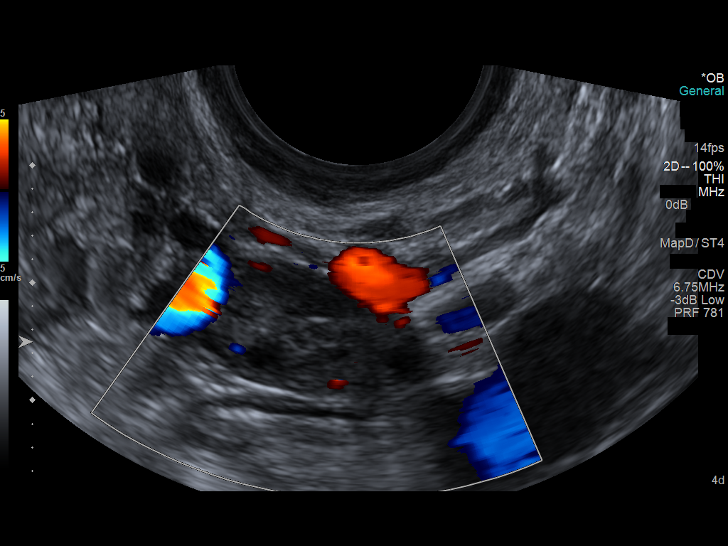
[im 37/37]
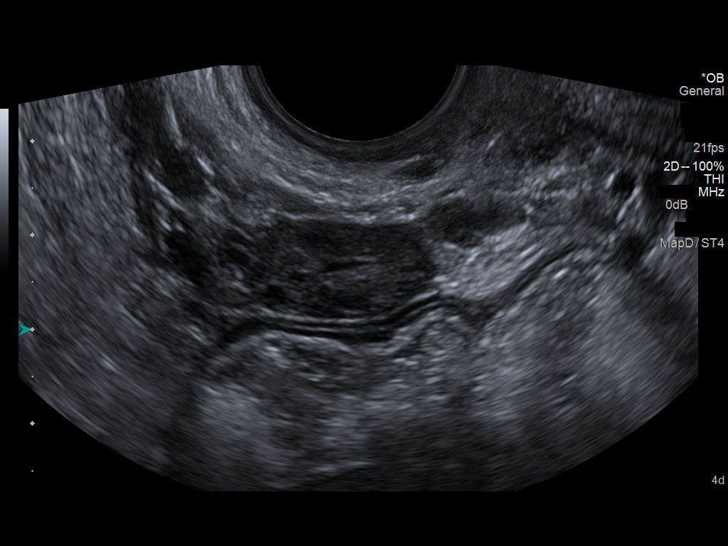

[14 of 28 positions shown; findings below may reference images not displayed]

IMPRESSION: 1. Single living intrauterine embryo corresponding to age of 8 weeks
4 days.
2. EDC by today's exam is 03/10/2014.
3. No evidence for adnexal mass or ectopic pregnancy.
FINDINGS: Intrauterine gestational sac: Present

Yolk sac:  Present

Embryo:  Present

Cardiac Activity: Present

Heart Rate: 168 bpm

CRL: 2.02 scar site 20.2 mm 8 w 4 d US EDC: 03/10/2014

Maternal uterus/adnexae: The ovaries have a normal appearance. No
subchorionic

## 2016-04-21 IMAGING — US US OB COMP LESS 14 WK
1 series · 14 of 28 positions shown · non-contrast
Comparison: None.

CLINICAL DATA: Pregnant, cramping, evaluate for viability

EXAM:
OBSTETRIC <14 WK US AND TRANSVAGINAL OB US
TECHNIQUE: Both transabdominal and transvaginal ultrasound examinations were
performed for complete evaluation of the gestation as well as the
maternal uterus, adnexal regions, and pelvic cul-de-sac.
Transvaginal technique was performed to assess early pregnancy.

[Series 1: us ob comp less 14 wks · 14 of 41 slices shown]
[im 2/41]
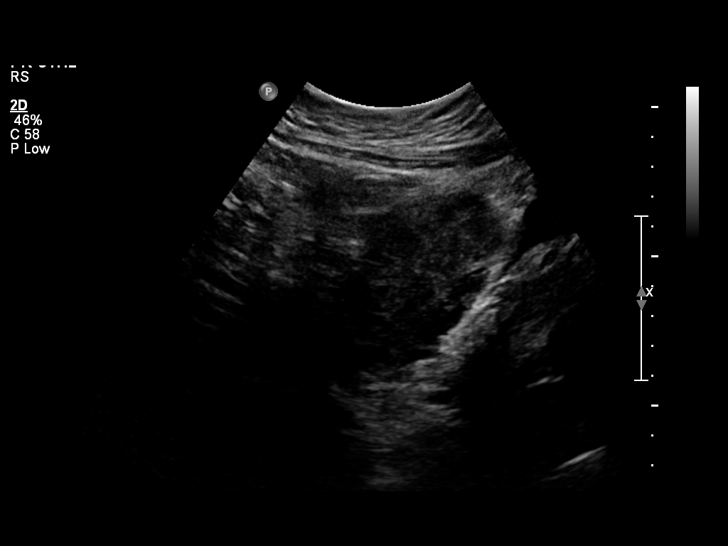
[im 5/41]
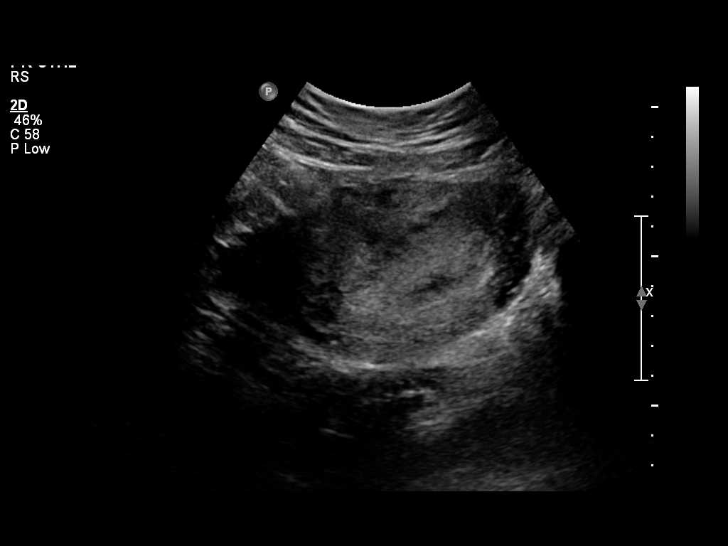
[im 8/41]
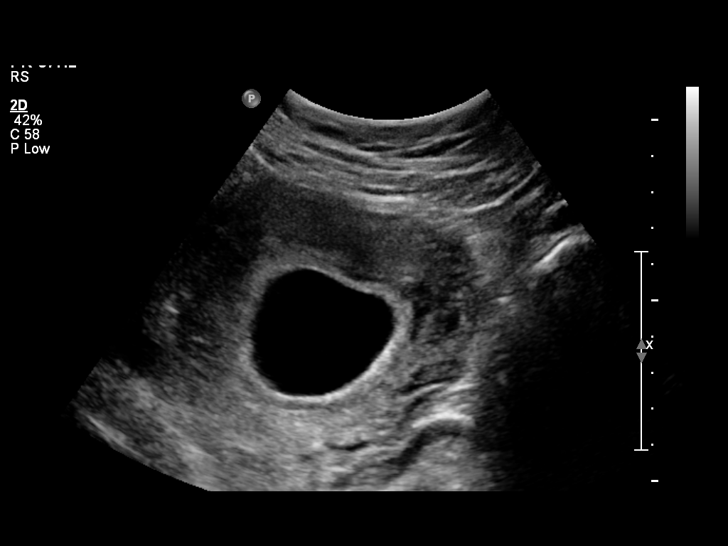
[im 11/41]
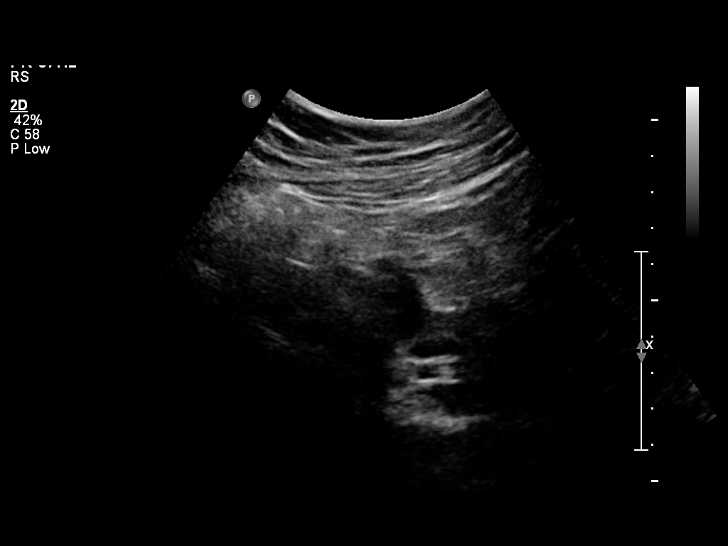
[im 14/41]
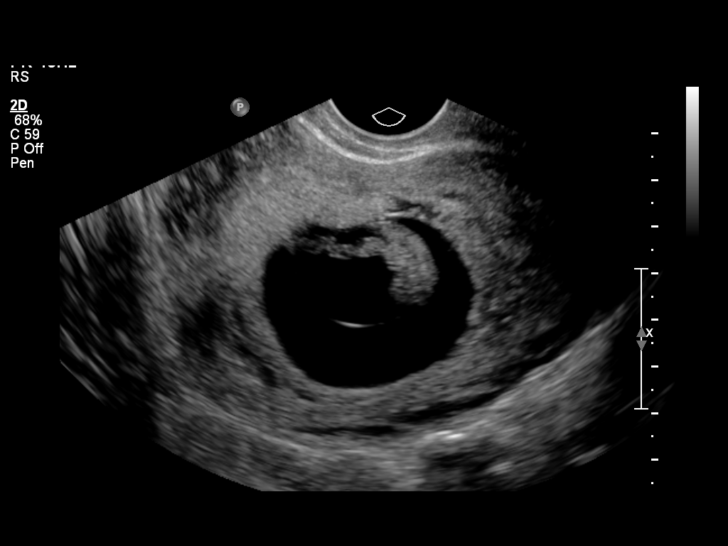
[im 17/41]
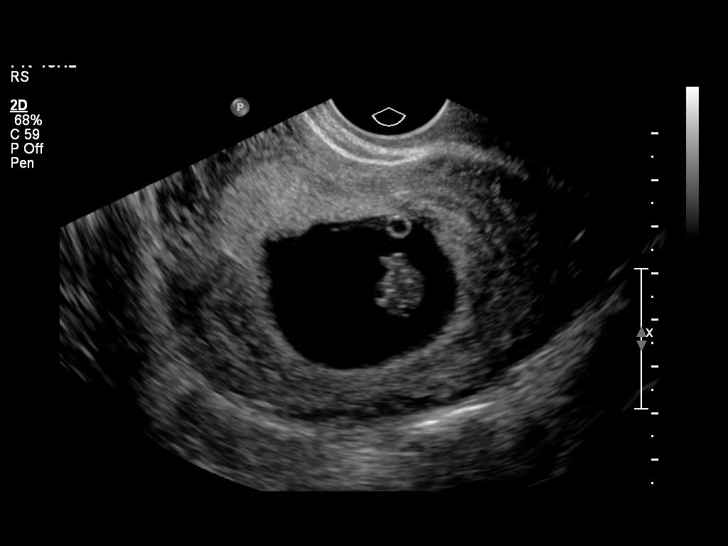
[im 20/41]
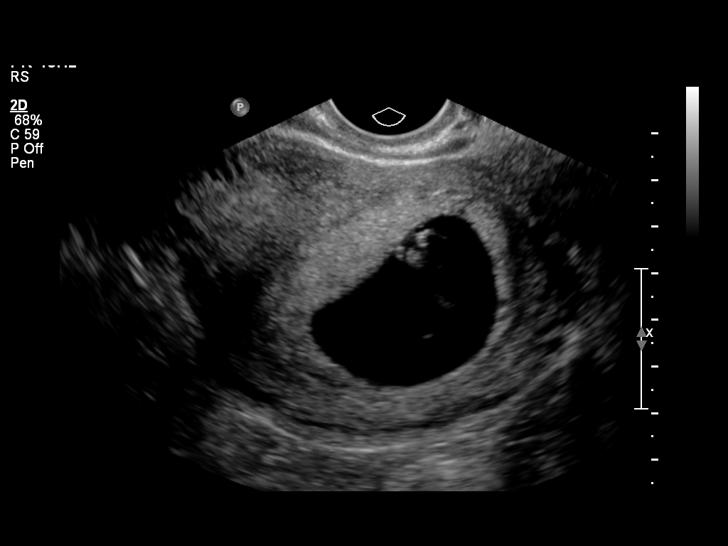
[im 23/41]
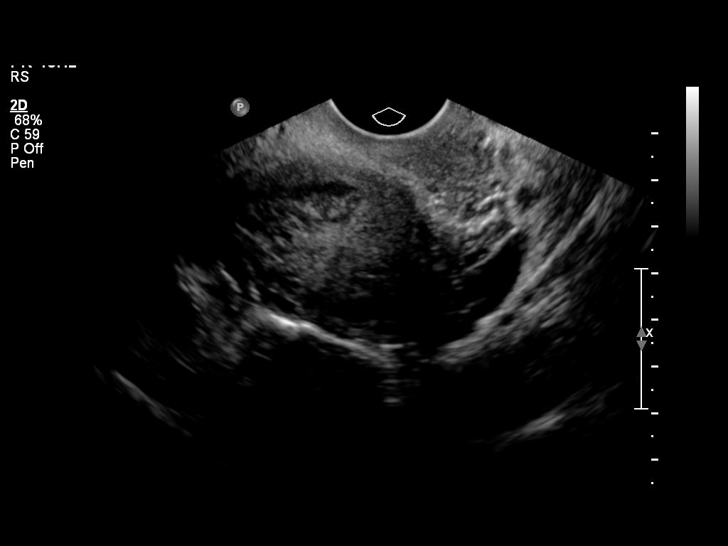
[im 26/41]
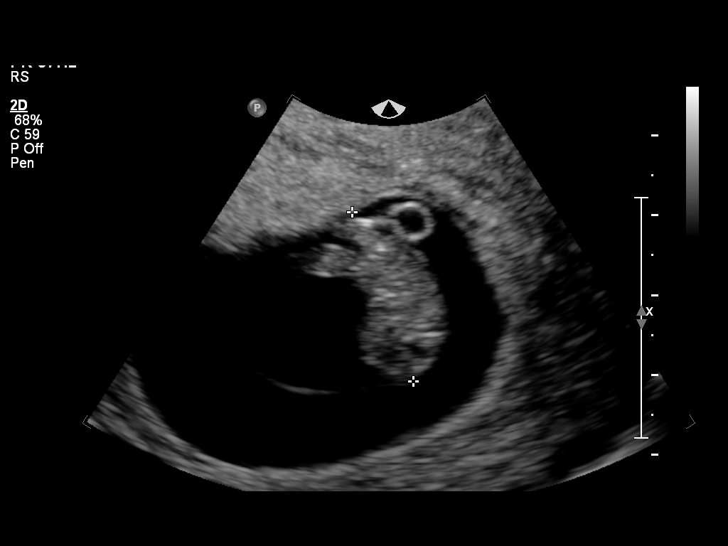
[im 29/41]
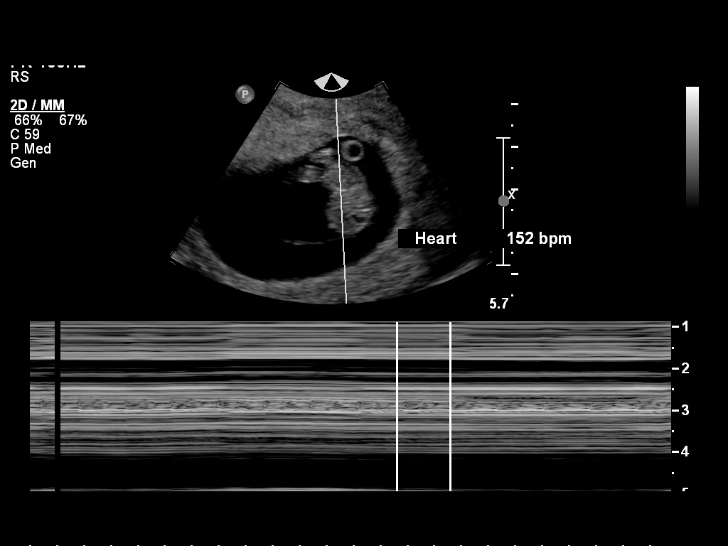
[im 32/41]
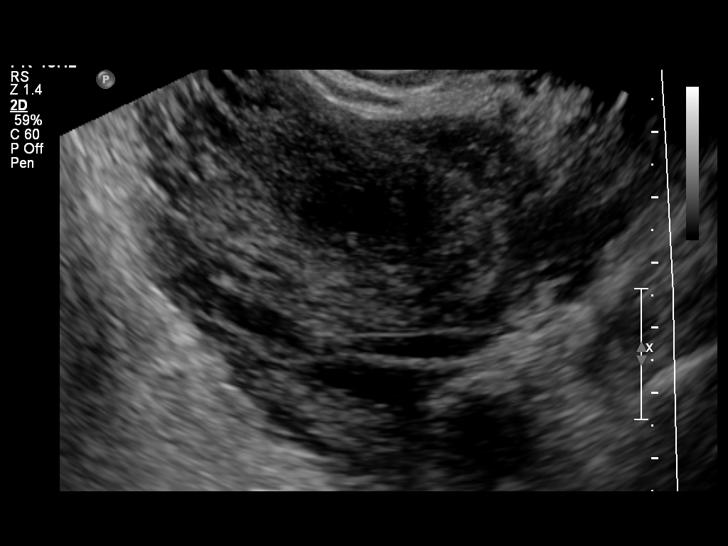
[im 35/41]
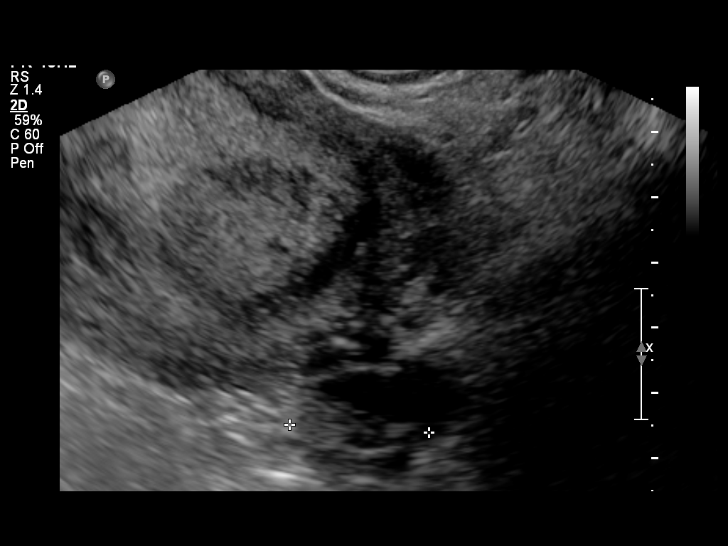
[im 38/41]
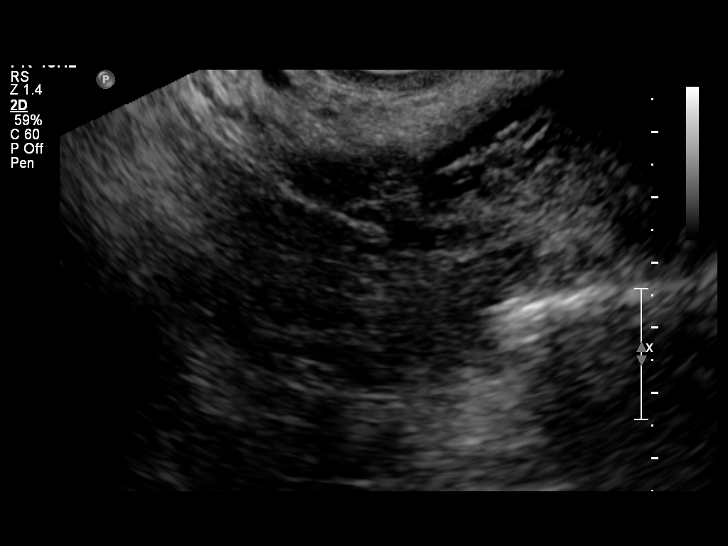
[im 41/41]
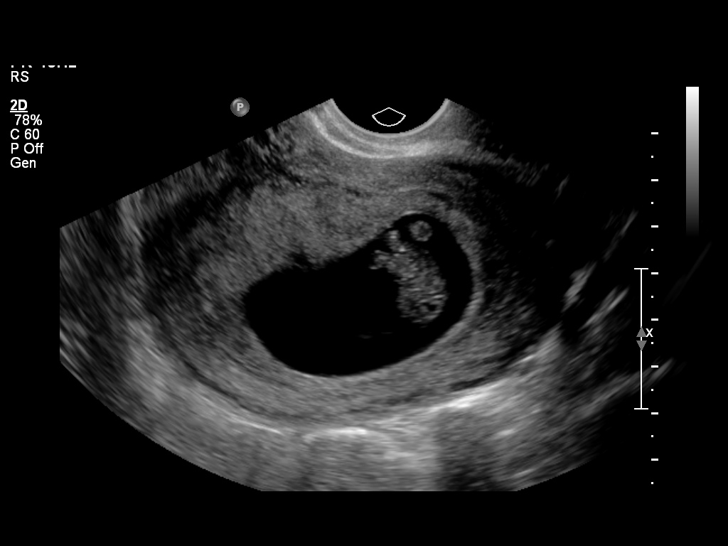

[14 of 28 positions shown; findings below may reference images not displayed]

FINDINGS: Intrauterine gestational sac: Visualized/normal in shape.

Yolk sac:  Present

Embryo:  Present

Cardiac Activity: Present

Heart Rate:  152 bpm

CRL:   22.4  mm   9 w 0 d                  US EDC: 12/09/2014

Maternal uterus/adnexae: No subchorionic hemorrhage.

Left ovary is within normal limits, measuring 3.2 x 1.3 x 2.1 cm.

Right ovary is within normal limits, measuring 4.0 x 2.7 x 2.2 cm.

No free fluid.
IMPRESSION: Single live intrauterine gestation with estimated gestational age of
9 weeks 0 days by crown-rump length.
